# Patient Record
Sex: Female | Born: 1955 | Race: White | Hispanic: No | Marital: Married | Smoking: Former smoker
Health system: Southern US, Community
[De-identification: ages and names within clinical notes are randomized; demographics above are authoritative.]

## PROBLEM LIST (undated history)

## (undated) DIAGNOSIS — E079 Disorder of thyroid, unspecified: Secondary | ICD-10-CM

## (undated) DIAGNOSIS — I10 Essential (primary) hypertension: Secondary | ICD-10-CM

## (undated) DIAGNOSIS — M1990 Unspecified osteoarthritis, unspecified site: Secondary | ICD-10-CM

## (undated) DIAGNOSIS — T7840XA Allergy, unspecified, initial encounter: Secondary | ICD-10-CM

## (undated) DIAGNOSIS — F419 Anxiety disorder, unspecified: Secondary | ICD-10-CM

## (undated) DIAGNOSIS — N159 Renal tubulo-interstitial disease, unspecified: Secondary | ICD-10-CM

## (undated) DIAGNOSIS — K449 Diaphragmatic hernia without obstruction or gangrene: Secondary | ICD-10-CM

## (undated) DIAGNOSIS — F32A Depression, unspecified: Secondary | ICD-10-CM

## (undated) DIAGNOSIS — R5383 Other fatigue: Secondary | ICD-10-CM

## (undated) DIAGNOSIS — M549 Dorsalgia, unspecified: Secondary | ICD-10-CM

## (undated) DIAGNOSIS — M419 Scoliosis, unspecified: Secondary | ICD-10-CM

## (undated) DIAGNOSIS — Z720 Tobacco use: Secondary | ICD-10-CM

## (undated) DIAGNOSIS — S46009A Unspecified injury of muscle(s) and tendon(s) of the rotator cuff of unspecified shoulder, initial encounter: Secondary | ICD-10-CM

## (undated) DIAGNOSIS — M25569 Pain in unspecified knee: Secondary | ICD-10-CM

## (undated) DIAGNOSIS — R7303 Prediabetes: Secondary | ICD-10-CM

## (undated) DIAGNOSIS — E785 Hyperlipidemia, unspecified: Secondary | ICD-10-CM

## (undated) DIAGNOSIS — R600 Localized edema: Secondary | ICD-10-CM

## (undated) DIAGNOSIS — E039 Hypothyroidism, unspecified: Secondary | ICD-10-CM

## (undated) DIAGNOSIS — Z6834 Body mass index (BMI) 34.0-34.9, adult: Secondary | ICD-10-CM

## (undated) DIAGNOSIS — E559 Vitamin D deficiency, unspecified: Secondary | ICD-10-CM

## (undated) DIAGNOSIS — M25559 Pain in unspecified hip: Secondary | ICD-10-CM

## (undated) DIAGNOSIS — E669 Obesity, unspecified: Secondary | ICD-10-CM

## (undated) DIAGNOSIS — K219 Gastro-esophageal reflux disease without esophagitis: Secondary | ICD-10-CM

## (undated) HISTORY — DX: Localized edema: R60.0

## (undated) HISTORY — PX: ROTATOR CUFF REPAIR: SHX139

## (undated) HISTORY — DX: Vitamin D deficiency, unspecified: E55.9

## (undated) HISTORY — DX: Tobacco use: Z72.0

## (undated) HISTORY — DX: Essential (primary) hypertension: I10

## (undated) HISTORY — DX: Renal tubulo-interstitial disease, unspecified: N15.9

## (undated) HISTORY — DX: Unspecified osteoarthritis, unspecified site: M19.90

## (undated) HISTORY — DX: Depression, unspecified: F32.A

## (undated) HISTORY — DX: Anxiety disorder, unspecified: F41.9

## (undated) HISTORY — DX: Body mass index (BMI) 34.0-34.9, adult: Z68.34

## (undated) HISTORY — DX: Allergy, unspecified, initial encounter: T78.40XA

## (undated) HISTORY — DX: Dorsalgia, unspecified: M54.9

## (undated) HISTORY — DX: Obesity, unspecified: E66.9

## (undated) HISTORY — DX: Diaphragmatic hernia without obstruction or gangrene: K44.9

## (undated) HISTORY — PX: DILATION AND CURETTAGE OF UTERUS: SHX78

## (undated) HISTORY — DX: Other fatigue: R53.83

## (undated) HISTORY — DX: Scoliosis, unspecified: M41.9

## (undated) HISTORY — DX: Hypothyroidism, unspecified: E03.9

## (undated) HISTORY — DX: Prediabetes: R73.03

## (undated) HISTORY — DX: Unspecified injury of muscle(s) and tendon(s) of the rotator cuff of unspecified shoulder, initial encounter: S46.009A

## (undated) HISTORY — DX: Disorder of thyroid, unspecified: E07.9

## (undated) HISTORY — DX: Gastro-esophageal reflux disease without esophagitis: K21.9

## (undated) HISTORY — DX: Hyperlipidemia, unspecified: E78.5

## (undated) HISTORY — DX: Pain in unspecified knee: M25.569

## (undated) HISTORY — DX: Pain in unspecified hip: M25.559

## (undated) HISTORY — PX: COLONOSCOPY: SHX174

---

## 2002-06-11 ENCOUNTER — Emergency Department (HOSPITAL_COMMUNITY): Admission: EM | Admit: 2002-06-11 | Discharge: 2002-06-12 | Payer: Self-pay | Admitting: Emergency Medicine

## 2011-03-20 NOTE — Consult Note (Signed)
NAME:  Theresa Butler, Theresa Butler                       ACCOUNT NO.:  [id number]   MEDICAL RECORD NO.:  [id number]                   PATIENT TYPE:  EMS   LOCATION:  [facility]                                 FACILITY:  [facility]   PHYSICIAN:  Mary A. Contogiannis, M.D.          DATE OF BIRTH:  17-Aug-1956   DATE OF CONSULTATION:  06/11/2002  DATE OF DISCHARGE:  06/12/2002                           PLASTIC SURGERY CONSULTATION   HISTORY OF PRESENT ILLNESS:  The patient is a 55 year old Caucasian female  who earlier tonight leaned down and smiled at a stray dog, and then was  bitten on her left upper lip and nose.  She thinks the dog may have been a  small collie.  This happened in [place].  The patient presents from [place]  with the request of repair of her lacerations.   PAST MEDICAL HISTORY:  Denies cardiac, lung, liver or kidney disease.  She  notes that her tetanus is up-to-date.   PAST SURGICAL HISTORY:  In 2000 she had a dilatation and curettage.   CURRENT MEDICATIONS:  Prozac.   ALLERGIES:  PENICILLIN and SEPTRA cause a rash, but she has taken Augmentin  before without any side effects or allergies.  CODEINE causes nausea and  vomiting.   SOCIAL HISTORY:  The patient smokes one to 1 packs per day of cigarettes.  She is married and lives in [place].   PHYSICAL EXAMINATION:  GENERAL:  Well-developed, well-nourished, 45-year-  old, Caucasian female in no acute distress.   HEENT:  Normocephalic.  Pupils equal, round and reactive to light.  Extraocular muscles intact.  Oropharynx is without erythema.  She has a 2.7  cm, left nasal, vertical, midline laceration with abrasions and contusions  that is intermediate grade.  She also has a 1.4 cm, complex, left cupid's  bow laceration that crosses the vermilion border and goes into the philtral  column area.  There is a complex, stellate, Y-shape, left upper lip  laceration with missing mucosa and partial soft tissue loss of the muscle  and  subcutaneous tissue with a defect that measures approximately 3 x 2 cm.  There is a 0.8 cm and 0.3 cm, simple, left upper lip lacerations.  Abrasions  and contusions are present around all of these lacerations.  Moderate  bruising and swelling is present of the left upper lip.   IMPRESSION:  Multiple complex, intermediate, simple dog bite lacerations to  the face with abrasions and contusions.  In particular, the left upper lip  has some missing soft tissue as well.  At the patient's request, these  lacerations and abrasions are all repaired in the emergency room under local  anesthesia.  The patient was then discharged home in stable condition in the  care of her husband with the following instructions:   1. Take Augmentin 875 mg p.o. b.i.d. x10 days.  2. Clean the repaired lacerations and incisions with water or half-strength     hydrogen peroxide  three times a day as needed to remove dried blood.  3.     Apply bacitracin ointment to the lacerations and abrasions three times a     day.  4. Ice packs to the left lip and nose areas for the next 24 to 48 hours.  5. Call my office at [phone] to discuss a followup appointment for Monday,     June 12, 2002.                                               Mary A. Contogiannis, M.D.    MAC/MEDQ  D:  06/13/2002  T:  06/17/2002  Job:  [id number]   cc:   Mary A. Contogiannis, M.D.

## 2011-03-20 NOTE — Op Note (Signed)
NAME:  Theresa Butler, Theresa Butler                       ACCOUNT NO.:  [id number]   MEDICAL RECORD NO.:  [id number]                   PATIENT TYPE:  EMS   LOCATION:  [facility]                                 FACILITY:  [facility]   PHYSICIAN:  Mary A. Contogiannis, M.D.          DATE OF BIRTH:  1955-12-31   DATE OF PROCEDURE:  06/11/2002  DATE OF DISCHARGE:  06/12/2002                                 OPERATIVE REPORT   PREOPERATIVE DIAGNOSIS:  Multiple dog bite lacerations to the face.   POSTOPERATIVE DIAGNOSIS:  Multiple dog bite lacerations to the face.   PROCEDURE:  1. Repair of complex, stellate Y shaped laceration to the left upper lip     with missing soft tissue defect measuring 3.3 by 2 cm using a local     rotation flap.  2. Debridement and repair of 1.4 cm complex left upper lip laceration.  3. Debridement and repair of 0.8 cm simple left upper lip laceration.  4. Debridement and repair of 0.3 cm simple left upper lip laceration.  5. Debridement and repair of intermediate 2.7 cm nasal laceration.   SURGEON:  Mary A. Contogiannis, M.D.   ANESTHESIA:  1% lidocaine with epinephrine.   COMPLICATIONS:  None.   INDICATIONS FOR PROCEDURE:  The patient is a 55 year old Caucasian female  who, on the evening of June 11, 2002, presented to the ER with request for  repair of several facial lacerations due to dog bite injury.  This was late  in the evening and so by the time that the repair is done, it is early on  the morning of June 12, 2002. The patient requested that we proceed with  repair of the lacerations.   PROCEDURE:  The patient was lying supine on the stretcher in the emergency  room.  The face was prepped with Betadine and draped in a sterile fashion.  The skin and subcutaneous tissues in all the areas of the dog bite injuries  were then injected with 1% lidocaine with epinephrine.  After adequate  hemostasis and anesthesia had taken affect, the procedure was begun.  First,  the  nasal laceration was debrided.  The wound was then irrigated with  saline.  The incision was closed in intermediate fashion using 4-0 Monocryl  in the deeper subcutaneous tissues followed by 5-0 Monocryl in the dermal  layer and a 6-0 Prolene running baseball stitch on the skin.   Attention was turned to the left upper lip.  There were several lacerations  in this area.  In particular, the patient had a complex, stellate Y shaped  left upper lip laceration over the left half of the lip that did have  missing mucosa along with missing subcutaneous tissue and muscle.  This  wound was then sharply debrided.  The soft tissue loss was not enough that  the wound could not be closed with a local rotation flap.  The wound was  irrigated with saline  irrigation.  A local rotation flap was then performed  from the soft tissues.  The deeper muscular layer was closed using 3-0  Monocryl suture.  The deeper subcutaneous tissues were then closed using 4-0  Monocryl suture.  The mucosal layer was closed with 4-0 Monocryl suture.  This laceration did cross the vermilion border and did go onto the skin  above the upper lip.  The dermal layer of the skin was also closed with 4-0  Monocryl suture.  The skin was closed with 6-0 Prolene in a running mattress  type suture beginning with the skin above the lip and going across the  vermilion border down to the wet mucosal line.  I was careful to realign the  vermilion border first before placing this suture.  The rest of the incision  was then closed using 5-0 chromic gut suture on the wet mucosa.   Additionally, the patient had a 1.4 cm complex left upper lip laceration in  the cupids bow area.  These tissues were sharply debrided, the wound was  irrigated with saline.  The muscular layer was closed using 4-0 Monocryl  suture.  The submucosal and dermal layer was closed using 4-0 Monocryl  suture.  The skin was closed with 6-0 Prolene suture.  Part of this did  have  a jagged edge in the filtral column area and that was debrided and repaired  as part of this laceration.  The total length of the incision for the  repaired rotation flap was 3.3 cm.  The patient also had two simple  lacerations on the left upper lip mucosa measuring 0.3 cm and 0.8 cm.  These  were debrided and then repaired using 5-0 chromic gut suture.  All the  incisions were then dressed with Bacitracin ointment.  There were no  complications.  The patient tolerated the procedure well.  By the time all  the repairs were repaired, it was early on the morning of June 12, 2002.   The patient was then discharged home in stable condition in care of her  husband with the following instructions:  1. Take Augmentin 875 mg p.o. b.i.d. x 10 days.  2. Clean the repair lacerations with water and hydrogen peroxide three times     a day to remove blood.  3. Apply Bacitracin ointment to the lacerations and abrasions three times a     day.  4. Use an ice pack for the left lip and nose areas for the next 24-48 hours.  5. Call my office at [phone] to schedule a follow up appointment for     Monday, June 12, 2002.                                               Mary A. Contogiannis, M.D.    MAC/MEDQ  D:  06/13/2002  T:  06/15/2002  Job:  [id number]

## 2011-09-06 ENCOUNTER — Ambulatory Visit (HOSPITAL_BASED_OUTPATIENT_CLINIC_OR_DEPARTMENT_OTHER): Payer: Medicaid Other

## 2017-08-03 DIAGNOSIS — E039 Hypothyroidism, unspecified: Secondary | ICD-10-CM

## 2017-08-03 DIAGNOSIS — R635 Abnormal weight gain: Secondary | ICD-10-CM

## 2017-08-03 HISTORY — DX: Hypothyroidism, unspecified: E03.9

## 2018-06-23 DIAGNOSIS — R3129 Other microscopic hematuria: Secondary | ICD-10-CM | POA: Diagnosis not present

## 2018-07-06 DIAGNOSIS — I8390 Asymptomatic varicose veins of unspecified lower extremity: Secondary | ICD-10-CM | POA: Diagnosis not present

## 2018-07-06 DIAGNOSIS — R600 Localized edema: Secondary | ICD-10-CM | POA: Diagnosis not present

## 2018-07-11 ENCOUNTER — Other Ambulatory Visit: Payer: Self-pay

## 2018-07-11 DIAGNOSIS — I83813 Varicose veins of bilateral lower extremities with pain: Secondary | ICD-10-CM

## 2018-08-08 DIAGNOSIS — R0602 Shortness of breath: Secondary | ICD-10-CM | POA: Diagnosis not present

## 2018-08-08 DIAGNOSIS — R600 Localized edema: Secondary | ICD-10-CM | POA: Diagnosis not present

## 2018-08-09 DIAGNOSIS — R079 Chest pain, unspecified: Secondary | ICD-10-CM | POA: Diagnosis not present

## 2018-09-08 DIAGNOSIS — R0602 Shortness of breath: Secondary | ICD-10-CM | POA: Diagnosis not present

## 2018-09-08 DIAGNOSIS — R600 Localized edema: Secondary | ICD-10-CM | POA: Diagnosis not present

## 2018-09-09 ENCOUNTER — Ambulatory Visit (HOSPITAL_COMMUNITY): Admission: RE | Admit: 2018-09-09 | Payer: Medicare Other | Source: Ambulatory Visit

## 2018-09-09 ENCOUNTER — Encounter: Payer: Medicare Other | Admitting: Vascular Surgery

## 2018-10-05 DIAGNOSIS — E039 Hypothyroidism, unspecified: Secondary | ICD-10-CM | POA: Diagnosis not present

## 2018-10-05 DIAGNOSIS — J329 Chronic sinusitis, unspecified: Secondary | ICD-10-CM | POA: Diagnosis not present

## 2018-10-05 DIAGNOSIS — R7303 Prediabetes: Secondary | ICD-10-CM | POA: Diagnosis not present

## 2018-10-05 DIAGNOSIS — E785 Hyperlipidemia, unspecified: Secondary | ICD-10-CM | POA: Diagnosis not present

## 2018-10-05 DIAGNOSIS — E559 Vitamin D deficiency, unspecified: Secondary | ICD-10-CM | POA: Diagnosis not present

## 2018-10-05 DIAGNOSIS — I10 Essential (primary) hypertension: Secondary | ICD-10-CM | POA: Diagnosis not present

## 2018-10-28 ENCOUNTER — Telehealth (HOSPITAL_COMMUNITY): Payer: Self-pay | Admitting: Surgery

## 2018-10-28 NOTE — Telephone Encounter (Signed)
Attempted to contact patient to confirm appointments for 10/31/2018 [facility] 

## 2018-10-31 ENCOUNTER — Encounter: Payer: Medicare Other | Admitting: Vascular Surgery

## 2018-10-31 ENCOUNTER — Encounter (HOSPITAL_COMMUNITY): Payer: Medicare Other

## 2018-11-06 DIAGNOSIS — J019 Acute sinusitis, unspecified: Secondary | ICD-10-CM | POA: Diagnosis not present

## 2018-11-06 DIAGNOSIS — H6692 Otitis media, unspecified, left ear: Secondary | ICD-10-CM | POA: Diagnosis not present

## 2018-11-23 DIAGNOSIS — M25562 Pain in left knee: Secondary | ICD-10-CM | POA: Diagnosis not present

## 2018-11-23 DIAGNOSIS — Z87828 Personal history of other (healed) physical injury and trauma: Secondary | ICD-10-CM | POA: Diagnosis not present

## 2018-11-30 DIAGNOSIS — M1712 Unilateral primary osteoarthritis, left knee: Secondary | ICD-10-CM | POA: Diagnosis not present

## 2018-11-30 DIAGNOSIS — M23212 Derangement of anterior horn of medial meniscus due to old tear or injury, left knee: Secondary | ICD-10-CM | POA: Diagnosis not present

## 2018-11-30 DIAGNOSIS — M25562 Pain in left knee: Secondary | ICD-10-CM | POA: Diagnosis not present

## 2018-11-30 DIAGNOSIS — M23222 Derangement of posterior horn of medial meniscus due to old tear or injury, left knee: Secondary | ICD-10-CM | POA: Diagnosis not present

## 2018-12-06 DIAGNOSIS — M1712 Unilateral primary osteoarthritis, left knee: Secondary | ICD-10-CM | POA: Diagnosis not present

## 2018-12-09 DIAGNOSIS — J301 Allergic rhinitis due to pollen: Secondary | ICD-10-CM | POA: Diagnosis not present

## 2018-12-20 DIAGNOSIS — R002 Palpitations: Secondary | ICD-10-CM | POA: Diagnosis not present

## 2018-12-20 DIAGNOSIS — R600 Localized edema: Secondary | ICD-10-CM | POA: Diagnosis not present

## 2018-12-23 ENCOUNTER — Inpatient Hospital Stay (HOSPITAL_COMMUNITY): Admission: RE | Admit: 2018-12-23 | Payer: Medicare Other | Source: Ambulatory Visit

## 2018-12-23 ENCOUNTER — Encounter: Payer: Medicare Other | Admitting: Vascular Surgery

## 2018-12-28 DIAGNOSIS — R3129 Other microscopic hematuria: Secondary | ICD-10-CM | POA: Diagnosis not present

## 2018-12-28 DIAGNOSIS — Z87448 Personal history of other diseases of urinary system: Secondary | ICD-10-CM | POA: Diagnosis not present

## 2018-12-28 DIAGNOSIS — R600 Localized edema: Secondary | ICD-10-CM | POA: Diagnosis not present

## 2018-12-29 ENCOUNTER — Encounter (HOSPITAL_COMMUNITY): Payer: Medicare Other

## 2018-12-29 ENCOUNTER — Encounter: Payer: Medicare Other | Admitting: Vascular Surgery

## 2019-01-10 DIAGNOSIS — R109 Unspecified abdominal pain: Secondary | ICD-10-CM | POA: Diagnosis not present

## 2019-01-10 DIAGNOSIS — R3129 Other microscopic hematuria: Secondary | ICD-10-CM | POA: Diagnosis not present

## 2019-01-12 DIAGNOSIS — E875 Hyperkalemia: Secondary | ICD-10-CM | POA: Diagnosis not present

## 2019-01-13 DIAGNOSIS — J309 Allergic rhinitis, unspecified: Secondary | ICD-10-CM | POA: Diagnosis not present

## 2019-01-14 DIAGNOSIS — J309 Allergic rhinitis, unspecified: Secondary | ICD-10-CM | POA: Diagnosis not present

## 2019-01-15 DIAGNOSIS — J309 Allergic rhinitis, unspecified: Secondary | ICD-10-CM | POA: Diagnosis not present

## 2019-01-16 DIAGNOSIS — J309 Allergic rhinitis, unspecified: Secondary | ICD-10-CM | POA: Diagnosis not present

## 2019-01-17 DIAGNOSIS — J309 Allergic rhinitis, unspecified: Secondary | ICD-10-CM | POA: Diagnosis not present

## 2019-01-18 DIAGNOSIS — J309 Allergic rhinitis, unspecified: Secondary | ICD-10-CM | POA: Diagnosis not present

## 2019-01-19 DIAGNOSIS — M1712 Unilateral primary osteoarthritis, left knee: Secondary | ICD-10-CM | POA: Diagnosis not present

## 2019-01-26 DIAGNOSIS — M1712 Unilateral primary osteoarthritis, left knee: Secondary | ICD-10-CM | POA: Diagnosis not present

## 2019-01-27 ENCOUNTER — Encounter: Payer: Self-pay | Admitting: Vascular Surgery

## 2019-01-30 ENCOUNTER — Encounter (HOSPITAL_COMMUNITY): Payer: Medicare Other

## 2019-01-30 ENCOUNTER — Encounter: Payer: Medicare Other | Admitting: Surgery

## 2019-02-02 DIAGNOSIS — M1712 Unilateral primary osteoarthritis, left knee: Secondary | ICD-10-CM | POA: Diagnosis not present

## 2019-02-07 DIAGNOSIS — Z79899 Other long term (current) drug therapy: Secondary | ICD-10-CM | POA: Diagnosis not present

## 2019-02-27 DIAGNOSIS — R319 Hematuria, unspecified: Secondary | ICD-10-CM | POA: Diagnosis not present

## 2019-02-27 DIAGNOSIS — R21 Rash and other nonspecific skin eruption: Secondary | ICD-10-CM | POA: Diagnosis not present

## 2019-02-27 DIAGNOSIS — I10 Essential (primary) hypertension: Secondary | ICD-10-CM | POA: Diagnosis not present

## 2019-02-27 DIAGNOSIS — R809 Proteinuria, unspecified: Secondary | ICD-10-CM | POA: Diagnosis not present

## 2019-02-27 DIAGNOSIS — D631 Anemia in chronic kidney disease: Secondary | ICD-10-CM | POA: Diagnosis not present

## 2019-03-30 DIAGNOSIS — R319 Hematuria, unspecified: Secondary | ICD-10-CM | POA: Diagnosis not present

## 2019-03-30 DIAGNOSIS — R809 Proteinuria, unspecified: Secondary | ICD-10-CM | POA: Diagnosis not present

## 2019-03-30 DIAGNOSIS — R21 Rash and other nonspecific skin eruption: Secondary | ICD-10-CM | POA: Diagnosis not present

## 2019-03-30 DIAGNOSIS — I10 Essential (primary) hypertension: Secondary | ICD-10-CM | POA: Diagnosis not present

## 2019-04-11 DIAGNOSIS — I10 Essential (primary) hypertension: Secondary | ICD-10-CM | POA: Diagnosis not present

## 2019-04-11 DIAGNOSIS — E559 Vitamin D deficiency, unspecified: Secondary | ICD-10-CM | POA: Diagnosis not present

## 2019-04-11 DIAGNOSIS — E039 Hypothyroidism, unspecified: Secondary | ICD-10-CM | POA: Diagnosis not present

## 2019-04-11 DIAGNOSIS — F329 Major depressive disorder, single episode, unspecified: Secondary | ICD-10-CM | POA: Diagnosis not present

## 2019-05-15 ENCOUNTER — Encounter: Payer: Self-pay | Admitting: Gastroenterology

## 2019-06-19 ENCOUNTER — Encounter: Payer: Medicare Other | Admitting: Gastroenterology

## 2019-06-27 DIAGNOSIS — J3081 Allergic rhinitis due to animal (cat) (dog) hair and dander: Secondary | ICD-10-CM | POA: Diagnosis not present

## 2019-06-27 DIAGNOSIS — M25562 Pain in left knee: Secondary | ICD-10-CM | POA: Diagnosis not present

## 2019-06-27 DIAGNOSIS — Z79891 Long term (current) use of opiate analgesic: Secondary | ICD-10-CM | POA: Diagnosis not present

## 2019-06-27 DIAGNOSIS — M542 Cervicalgia: Secondary | ICD-10-CM | POA: Diagnosis not present

## 2019-06-27 DIAGNOSIS — M25511 Pain in right shoulder: Secondary | ICD-10-CM | POA: Diagnosis not present

## 2019-06-27 DIAGNOSIS — M25512 Pain in left shoulder: Secondary | ICD-10-CM | POA: Diagnosis not present

## 2019-06-27 DIAGNOSIS — G894 Chronic pain syndrome: Secondary | ICD-10-CM | POA: Diagnosis not present

## 2019-06-27 DIAGNOSIS — J301 Allergic rhinitis due to pollen: Secondary | ICD-10-CM | POA: Diagnosis not present

## 2019-06-27 DIAGNOSIS — G8929 Other chronic pain: Secondary | ICD-10-CM | POA: Diagnosis not present

## 2019-06-27 DIAGNOSIS — F1120 Opioid dependence, uncomplicated: Secondary | ICD-10-CM | POA: Diagnosis not present

## 2019-06-27 DIAGNOSIS — M545 Low back pain: Secondary | ICD-10-CM | POA: Diagnosis not present

## 2019-10-02 DIAGNOSIS — Z20828 Contact with and (suspected) exposure to other viral communicable diseases: Secondary | ICD-10-CM | POA: Diagnosis not present

## 2019-10-02 DIAGNOSIS — R519 Headache, unspecified: Secondary | ICD-10-CM | POA: Diagnosis not present

## 2019-12-06 DIAGNOSIS — Z136 Encounter for screening for cardiovascular disorders: Secondary | ICD-10-CM | POA: Diagnosis not present

## 2019-12-06 DIAGNOSIS — Z0000 Encounter for general adult medical examination without abnormal findings: Secondary | ICD-10-CM | POA: Diagnosis not present

## 2019-12-06 DIAGNOSIS — Z9181 History of falling: Secondary | ICD-10-CM | POA: Diagnosis not present

## 2019-12-06 DIAGNOSIS — E785 Hyperlipidemia, unspecified: Secondary | ICD-10-CM | POA: Diagnosis not present

## 2019-12-06 DIAGNOSIS — Z1211 Encounter for screening for malignant neoplasm of colon: Secondary | ICD-10-CM | POA: Diagnosis not present

## 2019-12-12 ENCOUNTER — Encounter: Payer: Self-pay | Admitting: Gastroenterology

## 2019-12-18 DIAGNOSIS — R21 Rash and other nonspecific skin eruption: Secondary | ICD-10-CM | POA: Diagnosis not present

## 2019-12-18 DIAGNOSIS — L298 Other pruritus: Secondary | ICD-10-CM | POA: Diagnosis not present

## 2019-12-19 DIAGNOSIS — Z1231 Encounter for screening mammogram for malignant neoplasm of breast: Secondary | ICD-10-CM | POA: Diagnosis not present

## 2019-12-22 DIAGNOSIS — M542 Cervicalgia: Secondary | ICD-10-CM | POA: Diagnosis not present

## 2019-12-22 DIAGNOSIS — M25562 Pain in left knee: Secondary | ICD-10-CM | POA: Diagnosis not present

## 2019-12-22 DIAGNOSIS — G8929 Other chronic pain: Secondary | ICD-10-CM | POA: Diagnosis not present

## 2019-12-22 DIAGNOSIS — M545 Low back pain: Secondary | ICD-10-CM | POA: Diagnosis not present

## 2019-12-22 DIAGNOSIS — M25512 Pain in left shoulder: Secondary | ICD-10-CM | POA: Diagnosis not present

## 2019-12-22 DIAGNOSIS — M25511 Pain in right shoulder: Secondary | ICD-10-CM | POA: Diagnosis not present

## 2019-12-22 DIAGNOSIS — G894 Chronic pain syndrome: Secondary | ICD-10-CM | POA: Diagnosis not present

## 2019-12-22 DIAGNOSIS — Z79891 Long term (current) use of opiate analgesic: Secondary | ICD-10-CM | POA: Diagnosis not present

## 2020-01-09 ENCOUNTER — Ambulatory Visit (AMBULATORY_SURGERY_CENTER): Payer: Self-pay | Admitting: *Deleted

## 2020-01-09 ENCOUNTER — Other Ambulatory Visit: Payer: Self-pay

## 2020-01-09 VITALS — Temp 96.0°F | Ht 66.0 in | Wt 194.0 lb

## 2020-01-09 DIAGNOSIS — Z1211 Encounter for screening for malignant neoplasm of colon: Secondary | ICD-10-CM

## 2020-01-09 DIAGNOSIS — Z01818 Encounter for other preprocedural examination: Secondary | ICD-10-CM

## 2020-01-09 MED ORDER — SUPREP BOWEL PREP KIT 17.5-3.13-1.6 GM/177ML PO SOLN: ORAL | 0 refills | Status: DC

## 2020-01-09 NOTE — Progress Notes (Signed)
Pt is aware that care partner will wait in the car during procedure; if they feel like they will be too hot or cold to wait in the car; they may wait in the 4 th floor lobby. Patient is aware to bring only one care partner. We want them to wear a mask (we do not have any that we can provide them), practice social distancing, and we will check their temperatures when they get here.  I did remind the patient that their care partner needs to stay in the parking lot the entire time and have a cell phone available, we will call them when the pt is ready for discharge. Patient will wear mask into building.   No trouble with anesthesia, difficulty with intubation or hx/fam hx of malignant hyperthermia per pt   No egg or soy allergy  No home oxygen use   No medications for weight loss taken  covid test 01-19-20 at 2:05 pm

## 2020-01-19 ENCOUNTER — Other Ambulatory Visit: Payer: Self-pay

## 2020-01-19 ENCOUNTER — Other Ambulatory Visit: Payer: Self-pay | Admitting: Gastroenterology

## 2020-01-19 ENCOUNTER — Ambulatory Visit (INDEPENDENT_AMBULATORY_CARE_PROVIDER_SITE_OTHER): Payer: Medicare Other

## 2020-01-19 DIAGNOSIS — Z1159 Encounter for screening for other viral diseases: Secondary | ICD-10-CM | POA: Diagnosis not present

## 2020-01-20 LAB — SARS CORONAVIRUS 2 (TAT 6-24 HRS): SARS Coronavirus 2: NEGATIVE

## 2020-01-23 ENCOUNTER — Encounter: Payer: Self-pay | Admitting: Gastroenterology

## 2020-01-23 ENCOUNTER — Other Ambulatory Visit: Payer: Self-pay

## 2020-01-23 ENCOUNTER — Ambulatory Visit (AMBULATORY_SURGERY_CENTER): Payer: Medicare Other | Admitting: Gastroenterology

## 2020-01-23 VITALS — BP 144/72 | HR 63 | Temp 97.1°F | Resp 15 | Ht 66.0 in | Wt 194.0 lb

## 2020-01-23 DIAGNOSIS — Z03818 Encounter for observation for suspected exposure to other biological agents ruled out: Secondary | ICD-10-CM | POA: Diagnosis not present

## 2020-01-23 DIAGNOSIS — Z800 Family history of malignant neoplasm of digestive organs: Secondary | ICD-10-CM

## 2020-01-23 DIAGNOSIS — Z1211 Encounter for screening for malignant neoplasm of colon: Secondary | ICD-10-CM

## 2020-01-23 MED ORDER — SODIUM CHLORIDE 0.9 % IV SOLN: 500.0000 mL | Freq: Once | INTRAVENOUS | Status: DC

## 2020-01-23 NOTE — Op Note (Signed)
[facility] Patient Name: Theresa Butler Procedure Date: 01/23/2020 11:31 AM MRN: [id number] Endoscopist: Lynann Bologna , MD Age: 64 Referring MD:  Date of Birth: 11-04-1955 Gender: Female Account #: [id number] Procedure:                Colonoscopy Indications:              Screening for colorectal malignant neoplasm, Colon                            cancer screening in patient at increased risk:                            Family history of colorectal cancer in multiple 2nd                            degree relatives Medicines:                Monitored Anesthesia Care Procedure:                Pre-Anesthesia Assessment:                           - Prior to the procedure, a History and Physical                            was performed, and patient medications and                            allergies were reviewed. The patient's tolerance of                            previous anesthesia was also reviewed. The risks                            and benefits of the procedure and the sedation                            options and risks were discussed with the patient.                            All questions were answered, and informed consent                            was obtained. Prior Anticoagulants: The patient has                            taken no previous anticoagulant or antiplatelet                            agents. ASA Grade Assessment: II - A patient with                            mild systemic disease. After reviewing the risks  and benefits, the patient was deemed in                            satisfactory condition to undergo the procedure.                           After obtaining informed consent, the colonoscope                            was passed under direct vision. Throughout the                            procedure, the patient's blood pressure, pulse, and                            oxygen saturations were monitored  continuously. The                            Colonoscope CFH190 was introduced through the and                            advanced to the 2 cm into the ileum. The                            colonoscopy was somewhat difficult due to poor                            bowel prep (especially in the right side of the                            colon) and a tortuous colon. Successful completion                            of the procedure was aided by applying abdominal                            pressure and lavage. The patient tolerated the                            procedure well. The quality of the bowel                            preparation was adequate in the left side of the                            colon. However, there was significant retained                            adherent stool in the right side of the colon                            making it somewhat harder to visualize despite  2-day prep. Aggressive suctioning and aspiration                            was performed. Overall over 85-90% of the colonic                            mucosa was visualized satisfactorily. The terminal                            ileum, ileocecal valve, appendiceal orifice, and                            rectum were photographed. Scope In: 11:33:56 AM Scope Out: 11:53:38 AM Scope Withdrawal Time: 0 hours 11 minutes 21 seconds  Total Procedure Duration: 0 hours 19 minutes 42 seconds  Findings:                 The colon (entire examined portion) appeared normal                            except for mild melanosis coli. The colon was                            highly redundant.                           A few small-mouthed diverticula were found in the                            sigmoid colon.                           Non-bleeding internal hemorrhoids were found during                            retroflexion. The hemorrhoids were small.                           The terminal  ileum appeared normal. Complications:            No immediate complications. Estimated Blood Loss:     Estimated blood loss: none. Impression:               -Mild sigmoid diverticulosis.                           -Highly redundant colon.                           -Mild melanosis coli.                           -Non-bleeding internal hemorrhoids.                           -Otherwise grossly normal colonoscopy to TI. Recommendation:           - Patient has a contact number available for  emergencies. The signs and symptoms of potential                            delayed complications were discussed with the                            patient. Return to normal activities tomorrow.                            Written discharge instructions were provided to the                            patient.                           - High fiber diet.                           - Continue present medications.                           - Repeat colonoscopy in 5 years for screening                            purposes. Earlier, if with any new problems or if                            there is any change in family history.                           - Return to GI office PRN.                           - Miralax 1 capful (17 grams) in 8 ounces of water                            PO BID. Lynann Bologna, MD 01/23/2020 12:01:49 PM This report has been signed electronically.

## 2020-01-23 NOTE — Patient Instructions (Signed)
YOU HAD AN ENDOSCOPIC PROCEDURE TODAY AT THE [facility]:   Refer to the procedure report that was given to you for any specific questions about what was found during the examination.  If the procedure report does not answer your questions, please call your gastroenterologist to clarify.  If you requested that your care partner not be given the details of your procedure findings, then the procedure report has been included in a sealed envelope for you to review at your convenience later.  YOU SHOULD EXPECT: Some feelings of bloating in the abdomen. Passage of more gas than usual.  Walking can help get rid of the air that was put into your GI tract during the procedure and reduce the bloating. If you had a lower endoscopy (such as a colonoscopy or flexible sigmoidoscopy) you may notice spotting of blood in your stool or on the toilet paper. If you underwent a bowel prep for your procedure, you may not have a normal bowel movement for a few days.  Please Note:  You might notice some irritation and congestion in your nose or some drainage.  This is from the oxygen used during your procedure.  There is no need for concern and it should clear up in a day or so.  SYMPTOMS TO REPORT IMMEDIATELY:   Following lower endoscopy (colonoscopy or flexible sigmoidoscopy):  Excessive amounts of blood in the stool  Significant tenderness or worsening of abdominal pains  Swelling of the abdomen that is new, acute  Fever of 100F or higher  For urgent or emergent issues, a gastroenterologist can be reached at any hour by calling [phone]. Do not use MyChart messaging for urgent concerns.   DIET:  We do recommend a small meal at first, but then you may proceed to your regular diet.  Drink plenty of fluids but you should avoid alcoholic beverages for 24 hours.  ACTIVITY:  You should plan to take it easy for the rest of today and you should NOT DRIVE or use heavy machinery until tomorrow (because of  the sedation medicines used during the test).    FOLLOW UP: Our staff will call the number listed on your records 48-72 hours following your procedure to check on you and address any questions or concerns that you may have regarding the information given to you following your procedure. If we do not reach you, we will leave a message.  We will attempt to reach you two times.  During this call, we will ask if you have developed any symptoms of COVID 19. If you develop any symptoms (ie: fever, flu-like symptoms, shortness of breath, cough etc.) before then, please call [phone].  If you test positive for Covid 19 in the 2 weeks post procedure, please call and report this information to [place].    SIGNATURES/CONFIDENTIALITY: You and/or your care partner have signed paperwork which will be entered into your electronic medical record.  These signatures attest to the fact that that the information above on your After Visit Summary has been reviewed and is understood.  Full responsibility of the confidentiality of this discharge information lies with you and/or your care-partner.  Next colonoscopy- 5 years, please call if you have any new problems or changes in family history  Please follow high fiber diet- read over handouts about diverticulosis and hemorrhoids  Take Miralax 1 capful (17 grams) in 8 ounces of water twice daily

## 2020-01-23 NOTE — Progress Notes (Signed)
Report to PACU, RN, vss, BBS= Clear.  

## 2020-01-23 NOTE — Progress Notes (Signed)
Pt's states no medical or surgical changes since previsit or office visit.  Temp- June Vitals- Donna 

## 2020-01-24 ENCOUNTER — Telehealth: Payer: Self-pay | Admitting: Gastroenterology

## 2020-01-24 DIAGNOSIS — R109 Unspecified abdominal pain: Secondary | ICD-10-CM

## 2020-01-24 NOTE — Telephone Encounter (Signed)
Pt called with c/o gas pain under her left breast post colonoscopy on 3/23/ This was 7-8/10 this morning but she has since taken Gas-X with relief now 3/10. Will let Dr. Chales Abrahams know to see if further advice given,

## 2020-01-25 ENCOUNTER — Telehealth: Payer: Self-pay

## 2020-01-25 NOTE — Telephone Encounter (Signed)
I have called the Theresa Butler over the phone She does feel better Has been taking Gas-X. No fever or chills. She will let us know if she has any problems  Trish, can you please call her early next week to make sure she is okay  RG

## 2020-01-25 NOTE — Telephone Encounter (Signed)
  Follow up Call-  Call back number 01/23/2020  Post procedure Call Back phone  # [phone]  Permission to leave phone message Yes  Some recent data might be hidden     Patient questions:  Do you have a fever, pain , or abdominal swelling? Yes.   Pain Score  3 * Patient having some abdominal pain but states that it has greatly improved after taking gas medicine yesterday. Abdominal pressure and lavage was used during the procedure and it was communicated to the patient that this could also contribute to some soreness. Patient states that pain is getting a lot better and that she will call back if it begins to worsen.   Have you tolerated food without any problems? Yes.    Have you been able to return to your normal activities? Yes.    Do you have any questions about your discharge instructions: Diet   No. Medications  No. Follow up visit  No.  Do you have questions or concerns about your Care? No.  Actions: * If pain score is 4 or above: No action needed, pain <4.   1. Have you developed a fever since your procedure? No  2.   Have you had an respiratory symptoms (SOB or cough) since your procedure? No  3.   Have you tested positive for COVID 19 since your procedure No  4.   Have you had any family members/close contacts diagnosed with the COVID 19 since your procedure?  No   If yes to any of these questions please route to Laverna Peace, RN and Charlett Lango, RN

## 2020-01-26 ENCOUNTER — Ambulatory Visit (HOSPITAL_BASED_OUTPATIENT_CLINIC_OR_DEPARTMENT_OTHER)
Admission: RE | Admit: 2020-01-26 | Discharge: 2020-01-26 | Disposition: A | Discharge status: Home / Self Care | Payer: Medicare Other | Source: Ambulatory Visit | Attending: Gastroenterology | Admitting: Gastroenterology

## 2020-01-26 ENCOUNTER — Other Ambulatory Visit: Payer: Self-pay

## 2020-01-26 DIAGNOSIS — G8929 Other chronic pain: Secondary | ICD-10-CM | POA: Diagnosis not present

## 2020-01-26 DIAGNOSIS — R1012 Left upper quadrant pain: Secondary | ICD-10-CM | POA: Diagnosis not present

## 2020-01-26 DIAGNOSIS — G894 Chronic pain syndrome: Secondary | ICD-10-CM | POA: Diagnosis not present

## 2020-01-26 DIAGNOSIS — M542 Cervicalgia: Secondary | ICD-10-CM | POA: Diagnosis not present

## 2020-01-26 DIAGNOSIS — Z79891 Long term (current) use of opiate analgesic: Secondary | ICD-10-CM | POA: Diagnosis not present

## 2020-01-26 DIAGNOSIS — R109 Unspecified abdominal pain: Secondary | ICD-10-CM | POA: Insufficient documentation

## 2020-01-26 DIAGNOSIS — M25512 Pain in left shoulder: Secondary | ICD-10-CM | POA: Diagnosis not present

## 2020-01-26 DIAGNOSIS — M25562 Pain in left knee: Secondary | ICD-10-CM | POA: Diagnosis not present

## 2020-01-26 DIAGNOSIS — M25511 Pain in right shoulder: Secondary | ICD-10-CM | POA: Diagnosis not present

## 2020-01-26 DIAGNOSIS — M545 Low back pain: Secondary | ICD-10-CM | POA: Diagnosis not present

## 2020-01-26 NOTE — Telephone Encounter (Signed)
Pt called still feeling terrible did all that was suggested please call her back. Pt has doctor appointment  @ 3:00 pm today so before 3:00 please call this number [phone] after 3:00 pm please call [phone].

## 2020-01-26 NOTE — Telephone Encounter (Signed)
Per Dr. Chales Abrahams patient needs abdominal x ray. I have called and spoke to patient and she will come to [facility] and have this done today.

## 2020-02-19 DIAGNOSIS — Z03818 Encounter for observation for suspected exposure to other biological agents ruled out: Secondary | ICD-10-CM | POA: Diagnosis not present

## 2020-02-21 DIAGNOSIS — M1712 Unilateral primary osteoarthritis, left knee: Secondary | ICD-10-CM | POA: Diagnosis not present

## 2020-02-23 DIAGNOSIS — M25562 Pain in left knee: Secondary | ICD-10-CM | POA: Diagnosis not present

## 2020-02-23 DIAGNOSIS — Z79891 Long term (current) use of opiate analgesic: Secondary | ICD-10-CM | POA: Diagnosis not present

## 2020-02-23 DIAGNOSIS — M25511 Pain in right shoulder: Secondary | ICD-10-CM | POA: Diagnosis not present

## 2020-02-23 DIAGNOSIS — M542 Cervicalgia: Secondary | ICD-10-CM | POA: Diagnosis not present

## 2020-02-23 DIAGNOSIS — M25512 Pain in left shoulder: Secondary | ICD-10-CM | POA: Diagnosis not present

## 2020-02-23 DIAGNOSIS — G894 Chronic pain syndrome: Secondary | ICD-10-CM | POA: Diagnosis not present

## 2020-02-23 DIAGNOSIS — M545 Low back pain: Secondary | ICD-10-CM | POA: Diagnosis not present

## 2020-02-23 DIAGNOSIS — G8929 Other chronic pain: Secondary | ICD-10-CM | POA: Diagnosis not present

## 2020-02-29 NOTE — Telephone Encounter (Signed)
A user error has taken place: encounter opened in error, closed for administrative reasons.

## 2020-03-20 DIAGNOSIS — Z03818 Encounter for observation for suspected exposure to other biological agents ruled out: Secondary | ICD-10-CM | POA: Diagnosis not present

## 2020-03-22 DIAGNOSIS — M545 Low back pain: Secondary | ICD-10-CM | POA: Diagnosis not present

## 2020-03-22 DIAGNOSIS — M25511 Pain in right shoulder: Secondary | ICD-10-CM | POA: Diagnosis not present

## 2020-03-22 DIAGNOSIS — M25562 Pain in left knee: Secondary | ICD-10-CM | POA: Diagnosis not present

## 2020-03-22 DIAGNOSIS — G8929 Other chronic pain: Secondary | ICD-10-CM | POA: Diagnosis not present

## 2020-03-22 DIAGNOSIS — M25512 Pain in left shoulder: Secondary | ICD-10-CM | POA: Diagnosis not present

## 2020-03-22 DIAGNOSIS — G894 Chronic pain syndrome: Secondary | ICD-10-CM | POA: Diagnosis not present

## 2020-03-22 DIAGNOSIS — M542 Cervicalgia: Secondary | ICD-10-CM | POA: Diagnosis not present

## 2020-03-22 DIAGNOSIS — Z79891 Long term (current) use of opiate analgesic: Secondary | ICD-10-CM | POA: Diagnosis not present

## 2020-04-26 DIAGNOSIS — M25562 Pain in left knee: Secondary | ICD-10-CM | POA: Diagnosis not present

## 2020-04-26 DIAGNOSIS — M25511 Pain in right shoulder: Secondary | ICD-10-CM | POA: Diagnosis not present

## 2020-04-26 DIAGNOSIS — Z79891 Long term (current) use of opiate analgesic: Secondary | ICD-10-CM | POA: Diagnosis not present

## 2020-04-26 DIAGNOSIS — M545 Low back pain: Secondary | ICD-10-CM | POA: Diagnosis not present

## 2020-04-26 DIAGNOSIS — G894 Chronic pain syndrome: Secondary | ICD-10-CM | POA: Diagnosis not present

## 2020-04-26 DIAGNOSIS — M25512 Pain in left shoulder: Secondary | ICD-10-CM | POA: Diagnosis not present

## 2020-04-26 DIAGNOSIS — G8929 Other chronic pain: Secondary | ICD-10-CM | POA: Diagnosis not present

## 2020-04-26 DIAGNOSIS — M542 Cervicalgia: Secondary | ICD-10-CM | POA: Diagnosis not present

## 2020-05-15 DIAGNOSIS — M25559 Pain in unspecified hip: Secondary | ICD-10-CM | POA: Diagnosis not present

## 2020-05-15 DIAGNOSIS — R7303 Prediabetes: Secondary | ICD-10-CM | POA: Diagnosis not present

## 2020-05-15 DIAGNOSIS — E559 Vitamin D deficiency, unspecified: Secondary | ICD-10-CM | POA: Diagnosis not present

## 2020-05-30 DIAGNOSIS — M25562 Pain in left knee: Secondary | ICD-10-CM | POA: Diagnosis not present

## 2020-05-30 DIAGNOSIS — M25512 Pain in left shoulder: Secondary | ICD-10-CM | POA: Diagnosis not present

## 2020-05-30 DIAGNOSIS — G894 Chronic pain syndrome: Secondary | ICD-10-CM | POA: Diagnosis not present

## 2020-05-30 DIAGNOSIS — M545 Low back pain: Secondary | ICD-10-CM | POA: Diagnosis not present

## 2020-05-30 DIAGNOSIS — M25511 Pain in right shoulder: Secondary | ICD-10-CM | POA: Diagnosis not present

## 2020-05-30 DIAGNOSIS — G8929 Other chronic pain: Secondary | ICD-10-CM | POA: Diagnosis not present

## 2020-05-30 DIAGNOSIS — M542 Cervicalgia: Secondary | ICD-10-CM | POA: Diagnosis not present

## 2020-05-30 DIAGNOSIS — Z79891 Long term (current) use of opiate analgesic: Secondary | ICD-10-CM | POA: Diagnosis not present

## 2020-06-28 DIAGNOSIS — M25512 Pain in left shoulder: Secondary | ICD-10-CM | POA: Diagnosis not present

## 2020-06-28 DIAGNOSIS — M542 Cervicalgia: Secondary | ICD-10-CM | POA: Diagnosis not present

## 2020-06-28 DIAGNOSIS — Z79891 Long term (current) use of opiate analgesic: Secondary | ICD-10-CM | POA: Diagnosis not present

## 2020-06-28 DIAGNOSIS — M545 Low back pain: Secondary | ICD-10-CM | POA: Diagnosis not present

## 2020-06-28 DIAGNOSIS — M25511 Pain in right shoulder: Secondary | ICD-10-CM | POA: Diagnosis not present

## 2020-06-28 DIAGNOSIS — G894 Chronic pain syndrome: Secondary | ICD-10-CM | POA: Diagnosis not present

## 2020-06-28 DIAGNOSIS — G8929 Other chronic pain: Secondary | ICD-10-CM | POA: Diagnosis not present

## 2020-06-28 DIAGNOSIS — M25562 Pain in left knee: Secondary | ICD-10-CM | POA: Diagnosis not present

## 2020-07-17 DIAGNOSIS — J0100 Acute maxillary sinusitis, unspecified: Secondary | ICD-10-CM | POA: Diagnosis not present

## 2020-07-17 DIAGNOSIS — H66009 Acute suppurative otitis media without spontaneous rupture of ear drum, unspecified ear: Secondary | ICD-10-CM | POA: Diagnosis not present

## 2020-07-30 DIAGNOSIS — M25512 Pain in left shoulder: Secondary | ICD-10-CM | POA: Diagnosis not present

## 2020-07-30 DIAGNOSIS — G894 Chronic pain syndrome: Secondary | ICD-10-CM | POA: Diagnosis not present

## 2020-07-30 DIAGNOSIS — Z79891 Long term (current) use of opiate analgesic: Secondary | ICD-10-CM | POA: Diagnosis not present

## 2020-07-30 DIAGNOSIS — M542 Cervicalgia: Secondary | ICD-10-CM | POA: Diagnosis not present

## 2020-07-30 DIAGNOSIS — M25562 Pain in left knee: Secondary | ICD-10-CM | POA: Diagnosis not present

## 2020-07-30 DIAGNOSIS — M545 Low back pain: Secondary | ICD-10-CM | POA: Diagnosis not present

## 2020-07-30 DIAGNOSIS — M25511 Pain in right shoulder: Secondary | ICD-10-CM | POA: Diagnosis not present

## 2020-07-30 DIAGNOSIS — G8929 Other chronic pain: Secondary | ICD-10-CM | POA: Diagnosis not present

## 2020-08-08 IMAGING — DX DG ABDOMEN 2V
2 series · 2 of 2 positions shown · non-contrast
Comparison: 05/27/2018

CLINICAL DATA: Left upper quadrant pain, colonoscopy earlier this
week

EXAM:
ABDOMEN - 2 VIEW

[abdomen erect]
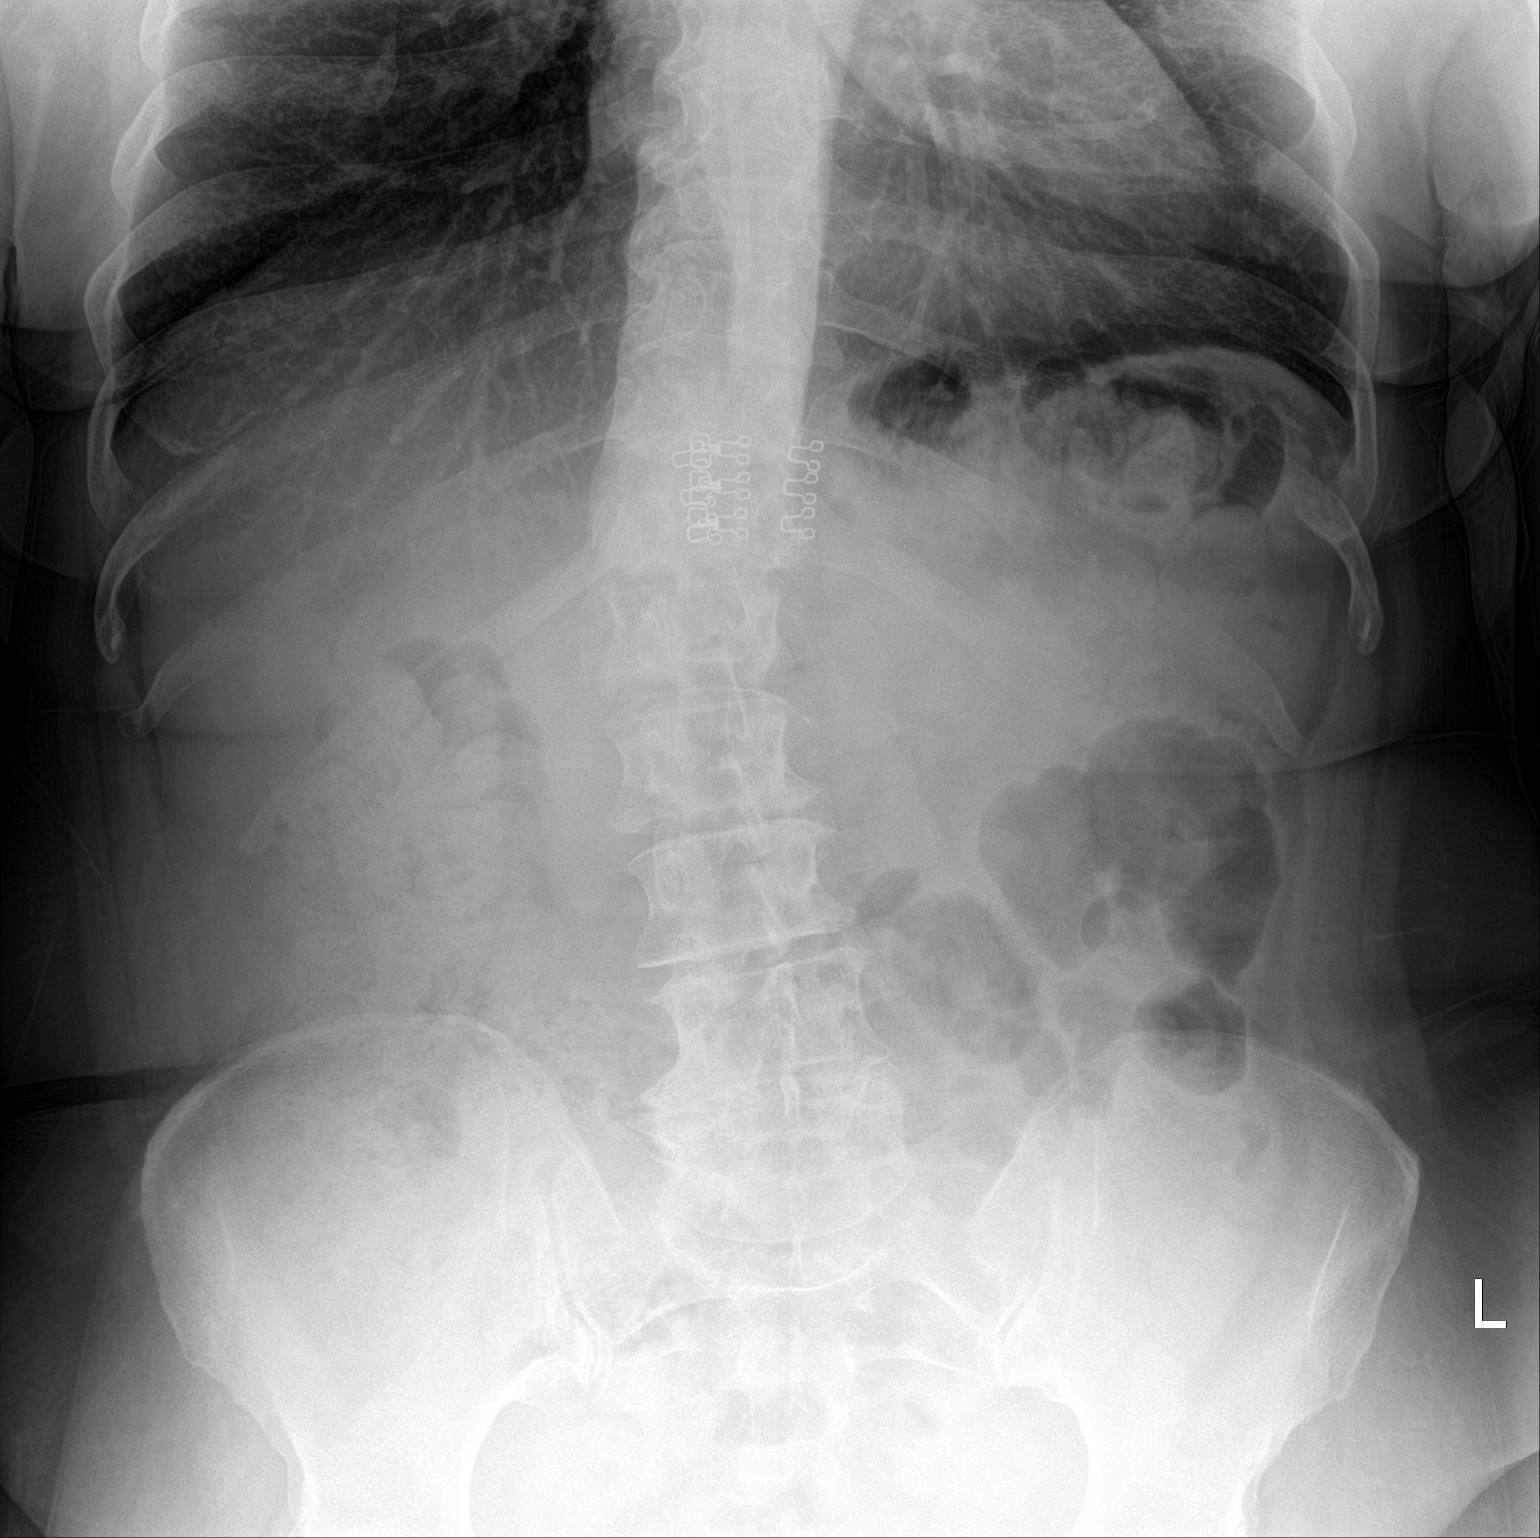

[abdomen supine]
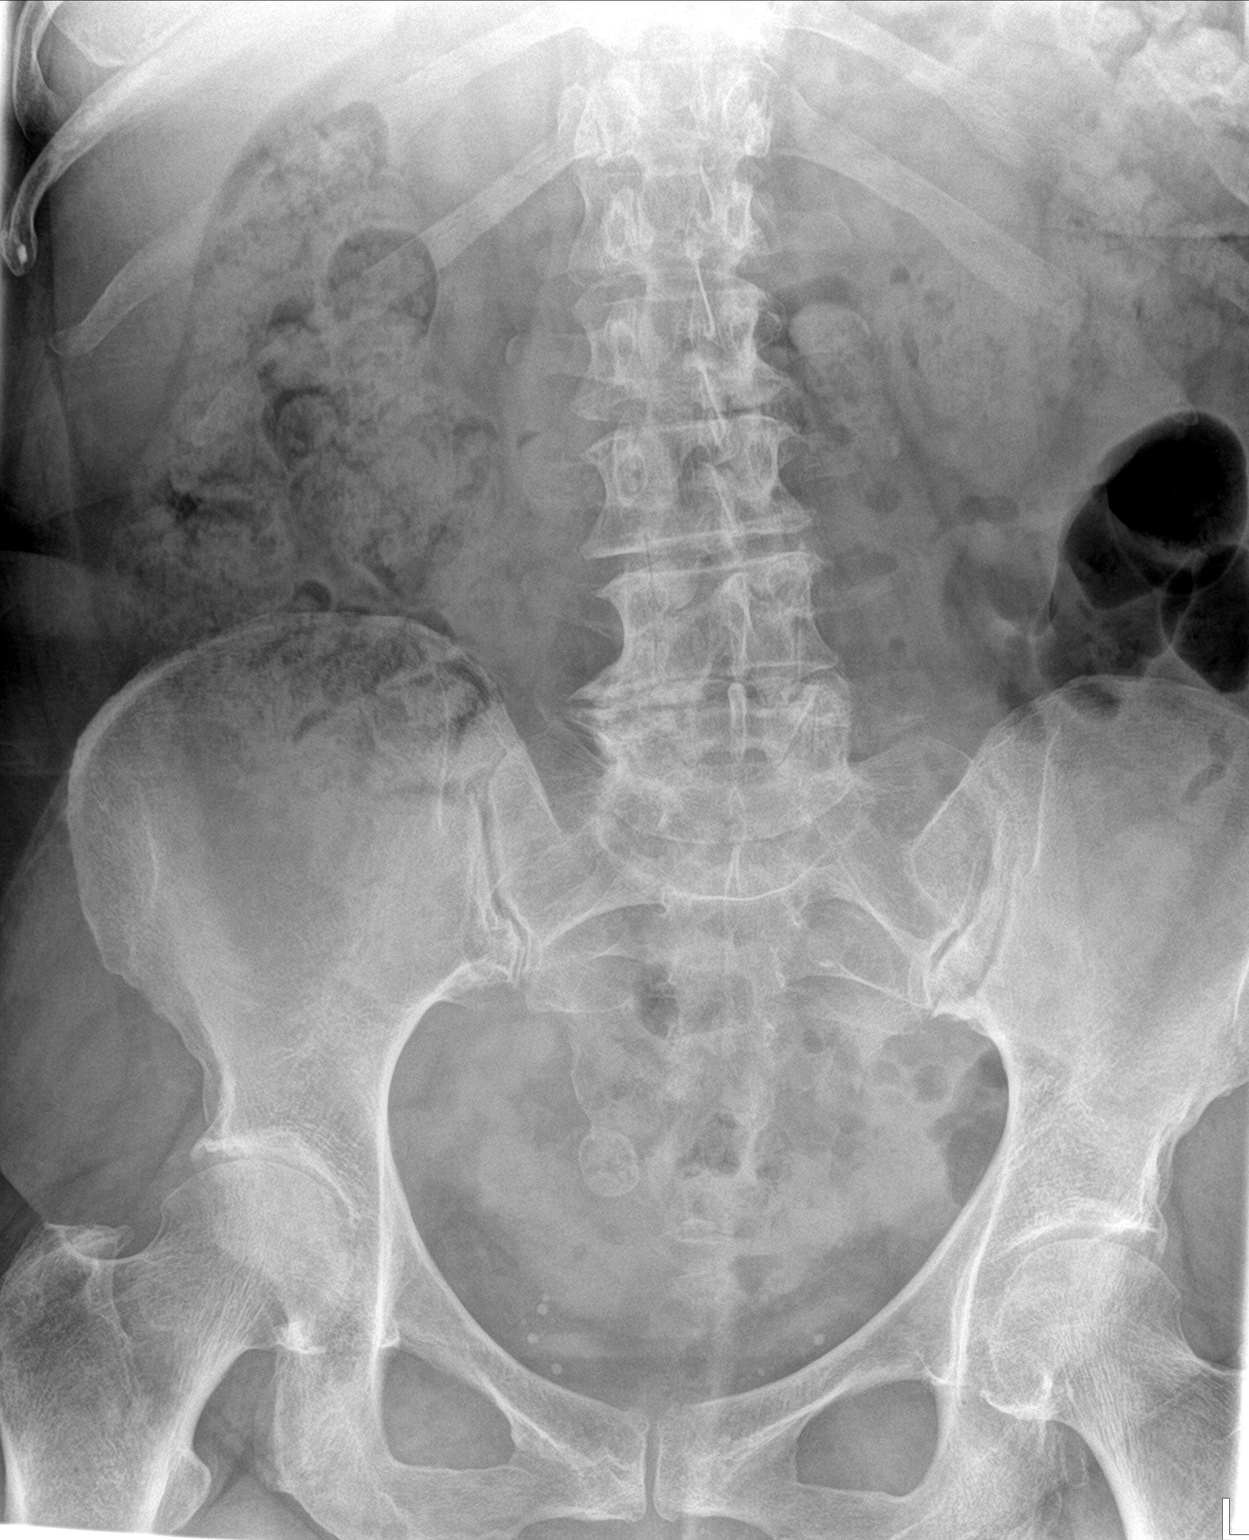

[2 of 2 positions shown; findings below may reference images not displayed]

FINDINGS: Supine and upright frontal views of the abdomen and pelvis are
obtained. No free gas in the greater peritoneal sac. There are no
masses or abnormal calcifications. Bowel gas pattern is unremarkable
without obstruction or ileus. Stable spondylosis within the lower
lumbar spine.
IMPRESSION: 1. Unremarkable bowel gas pattern.

## 2020-09-05 DIAGNOSIS — G894 Chronic pain syndrome: Secondary | ICD-10-CM | POA: Diagnosis not present

## 2020-09-05 DIAGNOSIS — M25512 Pain in left shoulder: Secondary | ICD-10-CM | POA: Diagnosis not present

## 2020-09-05 DIAGNOSIS — M542 Cervicalgia: Secondary | ICD-10-CM | POA: Diagnosis not present

## 2020-09-05 DIAGNOSIS — M25511 Pain in right shoulder: Secondary | ICD-10-CM | POA: Diagnosis not present

## 2020-09-05 DIAGNOSIS — Z79891 Long term (current) use of opiate analgesic: Secondary | ICD-10-CM | POA: Diagnosis not present

## 2020-09-05 DIAGNOSIS — G8929 Other chronic pain: Secondary | ICD-10-CM | POA: Diagnosis not present

## 2020-09-05 DIAGNOSIS — M545 Low back pain, unspecified: Secondary | ICD-10-CM | POA: Diagnosis not present

## 2020-09-05 DIAGNOSIS — M25562 Pain in left knee: Secondary | ICD-10-CM | POA: Diagnosis not present

## 2020-10-01 DIAGNOSIS — M1712 Unilateral primary osteoarthritis, left knee: Secondary | ICD-10-CM | POA: Diagnosis not present

## 2020-10-10 DIAGNOSIS — M25511 Pain in right shoulder: Secondary | ICD-10-CM | POA: Diagnosis not present

## 2020-10-10 DIAGNOSIS — M25512 Pain in left shoulder: Secondary | ICD-10-CM | POA: Diagnosis not present

## 2020-10-10 DIAGNOSIS — G8929 Other chronic pain: Secondary | ICD-10-CM | POA: Diagnosis not present

## 2020-10-10 DIAGNOSIS — G894 Chronic pain syndrome: Secondary | ICD-10-CM | POA: Diagnosis not present

## 2020-10-10 DIAGNOSIS — M542 Cervicalgia: Secondary | ICD-10-CM | POA: Diagnosis not present

## 2020-10-10 DIAGNOSIS — Z79891 Long term (current) use of opiate analgesic: Secondary | ICD-10-CM | POA: Diagnosis not present

## 2020-10-10 DIAGNOSIS — M25562 Pain in left knee: Secondary | ICD-10-CM | POA: Diagnosis not present

## 2020-10-10 DIAGNOSIS — M545 Low back pain, unspecified: Secondary | ICD-10-CM | POA: Diagnosis not present

## 2020-11-21 DIAGNOSIS — J3081 Allergic rhinitis due to animal (cat) (dog) hair and dander: Secondary | ICD-10-CM | POA: Diagnosis not present

## 2020-11-21 DIAGNOSIS — M545 Low back pain, unspecified: Secondary | ICD-10-CM | POA: Diagnosis not present

## 2020-11-21 DIAGNOSIS — M25511 Pain in right shoulder: Secondary | ICD-10-CM | POA: Diagnosis not present

## 2020-11-21 DIAGNOSIS — Z79891 Long term (current) use of opiate analgesic: Secondary | ICD-10-CM | POA: Diagnosis not present

## 2020-11-21 DIAGNOSIS — M25512 Pain in left shoulder: Secondary | ICD-10-CM | POA: Diagnosis not present

## 2020-11-21 DIAGNOSIS — J309 Allergic rhinitis, unspecified: Secondary | ICD-10-CM | POA: Diagnosis not present

## 2020-11-21 DIAGNOSIS — J301 Allergic rhinitis due to pollen: Secondary | ICD-10-CM | POA: Diagnosis not present

## 2020-11-21 DIAGNOSIS — M25562 Pain in left knee: Secondary | ICD-10-CM | POA: Diagnosis not present

## 2020-11-21 DIAGNOSIS — G8929 Other chronic pain: Secondary | ICD-10-CM | POA: Diagnosis not present

## 2020-11-21 DIAGNOSIS — G894 Chronic pain syndrome: Secondary | ICD-10-CM | POA: Diagnosis not present

## 2020-11-21 DIAGNOSIS — J3089 Other allergic rhinitis: Secondary | ICD-10-CM | POA: Diagnosis not present

## 2020-11-21 DIAGNOSIS — M542 Cervicalgia: Secondary | ICD-10-CM | POA: Diagnosis not present

## 2020-11-27 DIAGNOSIS — M25472 Effusion, left ankle: Secondary | ICD-10-CM | POA: Diagnosis not present

## 2020-11-27 DIAGNOSIS — L539 Erythematous condition, unspecified: Secondary | ICD-10-CM | POA: Diagnosis not present

## 2020-11-27 DIAGNOSIS — Z872 Personal history of diseases of the skin and subcutaneous tissue: Secondary | ICD-10-CM | POA: Diagnosis not present

## 2020-11-27 DIAGNOSIS — I872 Venous insufficiency (chronic) (peripheral): Secondary | ICD-10-CM | POA: Diagnosis not present

## 2020-11-27 DIAGNOSIS — I8002 Phlebitis and thrombophlebitis of superficial vessels of left lower extremity: Secondary | ICD-10-CM | POA: Diagnosis not present

## 2020-11-27 DIAGNOSIS — M25572 Pain in left ankle and joints of left foot: Secondary | ICD-10-CM | POA: Diagnosis not present

## 2020-11-27 DIAGNOSIS — M7989 Other specified soft tissue disorders: Secondary | ICD-10-CM | POA: Diagnosis not present

## 2020-11-27 DIAGNOSIS — M79605 Pain in left leg: Secondary | ICD-10-CM | POA: Diagnosis not present

## 2020-12-11 DIAGNOSIS — Z9181 History of falling: Secondary | ICD-10-CM | POA: Diagnosis not present

## 2020-12-11 DIAGNOSIS — Z0000 Encounter for general adult medical examination without abnormal findings: Secondary | ICD-10-CM | POA: Diagnosis not present

## 2020-12-20 DIAGNOSIS — M545 Low back pain, unspecified: Secondary | ICD-10-CM | POA: Diagnosis not present

## 2020-12-20 DIAGNOSIS — J301 Allergic rhinitis due to pollen: Secondary | ICD-10-CM | POA: Diagnosis not present

## 2020-12-20 DIAGNOSIS — J3081 Allergic rhinitis due to animal (cat) (dog) hair and dander: Secondary | ICD-10-CM | POA: Diagnosis not present

## 2020-12-20 DIAGNOSIS — M25511 Pain in right shoulder: Secondary | ICD-10-CM | POA: Diagnosis not present

## 2020-12-20 DIAGNOSIS — J3089 Other allergic rhinitis: Secondary | ICD-10-CM | POA: Diagnosis not present

## 2020-12-20 DIAGNOSIS — Z79891 Long term (current) use of opiate analgesic: Secondary | ICD-10-CM | POA: Diagnosis not present

## 2020-12-20 DIAGNOSIS — G8929 Other chronic pain: Secondary | ICD-10-CM | POA: Diagnosis not present

## 2020-12-20 DIAGNOSIS — R300 Dysuria: Secondary | ICD-10-CM | POA: Diagnosis not present

## 2020-12-20 DIAGNOSIS — M25562 Pain in left knee: Secondary | ICD-10-CM | POA: Diagnosis not present

## 2020-12-20 DIAGNOSIS — M542 Cervicalgia: Secondary | ICD-10-CM | POA: Diagnosis not present

## 2020-12-20 DIAGNOSIS — N390 Urinary tract infection, site not specified: Secondary | ICD-10-CM | POA: Diagnosis not present

## 2020-12-20 DIAGNOSIS — G894 Chronic pain syndrome: Secondary | ICD-10-CM | POA: Diagnosis not present

## 2020-12-20 DIAGNOSIS — J309 Allergic rhinitis, unspecified: Secondary | ICD-10-CM | POA: Diagnosis not present

## 2020-12-20 DIAGNOSIS — M25512 Pain in left shoulder: Secondary | ICD-10-CM | POA: Diagnosis not present

## 2021-01-01 DIAGNOSIS — J0100 Acute maxillary sinusitis, unspecified: Secondary | ICD-10-CM | POA: Diagnosis not present

## 2021-01-01 DIAGNOSIS — R0981 Nasal congestion: Secondary | ICD-10-CM | POA: Diagnosis not present

## 2021-01-14 DIAGNOSIS — H43813 Vitreous degeneration, bilateral: Secondary | ICD-10-CM | POA: Diagnosis not present

## 2021-01-19 DIAGNOSIS — G894 Chronic pain syndrome: Secondary | ICD-10-CM | POA: Diagnosis not present

## 2021-01-21 DIAGNOSIS — Z20822 Contact with and (suspected) exposure to covid-19: Secondary | ICD-10-CM | POA: Diagnosis not present

## 2021-01-21 DIAGNOSIS — Z03818 Encounter for observation for suspected exposure to other biological agents ruled out: Secondary | ICD-10-CM | POA: Diagnosis not present

## 2021-01-22 DIAGNOSIS — J329 Chronic sinusitis, unspecified: Secondary | ICD-10-CM | POA: Diagnosis not present

## 2021-01-24 DIAGNOSIS — M25511 Pain in right shoulder: Secondary | ICD-10-CM | POA: Diagnosis not present

## 2021-01-24 DIAGNOSIS — M545 Low back pain, unspecified: Secondary | ICD-10-CM | POA: Diagnosis not present

## 2021-01-24 DIAGNOSIS — G894 Chronic pain syndrome: Secondary | ICD-10-CM | POA: Diagnosis not present

## 2021-01-24 DIAGNOSIS — J309 Allergic rhinitis, unspecified: Secondary | ICD-10-CM | POA: Diagnosis not present

## 2021-01-24 DIAGNOSIS — M25512 Pain in left shoulder: Secondary | ICD-10-CM | POA: Diagnosis not present

## 2021-01-24 DIAGNOSIS — J301 Allergic rhinitis due to pollen: Secondary | ICD-10-CM | POA: Diagnosis not present

## 2021-01-24 DIAGNOSIS — M25562 Pain in left knee: Secondary | ICD-10-CM | POA: Diagnosis not present

## 2021-01-24 DIAGNOSIS — G8929 Other chronic pain: Secondary | ICD-10-CM | POA: Diagnosis not present

## 2021-01-24 DIAGNOSIS — M542 Cervicalgia: Secondary | ICD-10-CM | POA: Diagnosis not present

## 2021-01-24 DIAGNOSIS — Z79891 Long term (current) use of opiate analgesic: Secondary | ICD-10-CM | POA: Diagnosis not present

## 2021-01-24 DIAGNOSIS — J3081 Allergic rhinitis due to animal (cat) (dog) hair and dander: Secondary | ICD-10-CM | POA: Diagnosis not present

## 2021-01-24 DIAGNOSIS — J3089 Other allergic rhinitis: Secondary | ICD-10-CM | POA: Diagnosis not present

## 2021-01-27 DIAGNOSIS — H43813 Vitreous degeneration, bilateral: Secondary | ICD-10-CM | POA: Diagnosis not present

## 2021-02-18 DIAGNOSIS — G894 Chronic pain syndrome: Secondary | ICD-10-CM | POA: Diagnosis not present

## 2021-02-21 DIAGNOSIS — M545 Low back pain, unspecified: Secondary | ICD-10-CM | POA: Diagnosis not present

## 2021-02-21 DIAGNOSIS — G894 Chronic pain syndrome: Secondary | ICD-10-CM | POA: Diagnosis not present

## 2021-02-21 DIAGNOSIS — J309 Allergic rhinitis, unspecified: Secondary | ICD-10-CM | POA: Diagnosis not present

## 2021-02-21 DIAGNOSIS — J301 Allergic rhinitis due to pollen: Secondary | ICD-10-CM | POA: Diagnosis not present

## 2021-02-21 DIAGNOSIS — M25562 Pain in left knee: Secondary | ICD-10-CM | POA: Diagnosis not present

## 2021-02-21 DIAGNOSIS — M25512 Pain in left shoulder: Secondary | ICD-10-CM | POA: Diagnosis not present

## 2021-02-21 DIAGNOSIS — J3089 Other allergic rhinitis: Secondary | ICD-10-CM | POA: Diagnosis not present

## 2021-02-21 DIAGNOSIS — J3081 Allergic rhinitis due to animal (cat) (dog) hair and dander: Secondary | ICD-10-CM | POA: Diagnosis not present

## 2021-02-21 DIAGNOSIS — G8929 Other chronic pain: Secondary | ICD-10-CM | POA: Diagnosis not present

## 2021-02-21 DIAGNOSIS — Z79891 Long term (current) use of opiate analgesic: Secondary | ICD-10-CM | POA: Diagnosis not present

## 2021-02-21 DIAGNOSIS — M542 Cervicalgia: Secondary | ICD-10-CM | POA: Diagnosis not present

## 2021-02-21 DIAGNOSIS — M25511 Pain in right shoulder: Secondary | ICD-10-CM | POA: Diagnosis not present

## 2021-02-25 DIAGNOSIS — Z23 Encounter for immunization: Secondary | ICD-10-CM | POA: Diagnosis not present

## 2021-02-25 DIAGNOSIS — E039 Hypothyroidism, unspecified: Secondary | ICD-10-CM | POA: Diagnosis not present

## 2021-02-25 DIAGNOSIS — R5383 Other fatigue: Secondary | ICD-10-CM | POA: Diagnosis not present

## 2021-03-10 DIAGNOSIS — N3001 Acute cystitis with hematuria: Secondary | ICD-10-CM | POA: Diagnosis not present

## 2021-03-10 DIAGNOSIS — R319 Hematuria, unspecified: Secondary | ICD-10-CM | POA: Diagnosis not present

## 2021-03-10 DIAGNOSIS — N3090 Cystitis, unspecified without hematuria: Secondary | ICD-10-CM | POA: Diagnosis not present

## 2021-03-10 DIAGNOSIS — R519 Headache, unspecified: Secondary | ICD-10-CM | POA: Diagnosis not present

## 2021-03-10 DIAGNOSIS — R300 Dysuria: Secondary | ICD-10-CM | POA: Diagnosis not present

## 2021-03-20 DIAGNOSIS — G894 Chronic pain syndrome: Secondary | ICD-10-CM | POA: Diagnosis not present

## 2021-03-28 DIAGNOSIS — J329 Chronic sinusitis, unspecified: Secondary | ICD-10-CM | POA: Diagnosis not present

## 2021-03-28 DIAGNOSIS — R0981 Nasal congestion: Secondary | ICD-10-CM | POA: Diagnosis not present

## 2021-03-28 DIAGNOSIS — J3489 Other specified disorders of nose and nasal sinuses: Secondary | ICD-10-CM | POA: Diagnosis not present

## 2021-03-28 DIAGNOSIS — J343 Hypertrophy of nasal turbinates: Secondary | ICD-10-CM | POA: Diagnosis not present

## 2021-03-28 DIAGNOSIS — J342 Deviated nasal septum: Secondary | ICD-10-CM | POA: Diagnosis not present

## 2021-04-11 DIAGNOSIS — M545 Low back pain, unspecified: Secondary | ICD-10-CM | POA: Diagnosis not present

## 2021-04-11 DIAGNOSIS — G894 Chronic pain syndrome: Secondary | ICD-10-CM | POA: Diagnosis not present

## 2021-04-11 DIAGNOSIS — J3089 Other allergic rhinitis: Secondary | ICD-10-CM | POA: Diagnosis not present

## 2021-04-11 DIAGNOSIS — M25512 Pain in left shoulder: Secondary | ICD-10-CM | POA: Diagnosis not present

## 2021-04-11 DIAGNOSIS — M25511 Pain in right shoulder: Secondary | ICD-10-CM | POA: Diagnosis not present

## 2021-04-11 DIAGNOSIS — M542 Cervicalgia: Secondary | ICD-10-CM | POA: Diagnosis not present

## 2021-04-11 DIAGNOSIS — J301 Allergic rhinitis due to pollen: Secondary | ICD-10-CM | POA: Diagnosis not present

## 2021-04-11 DIAGNOSIS — J309 Allergic rhinitis, unspecified: Secondary | ICD-10-CM | POA: Diagnosis not present

## 2021-04-11 DIAGNOSIS — Z79891 Long term (current) use of opiate analgesic: Secondary | ICD-10-CM | POA: Diagnosis not present

## 2021-04-11 DIAGNOSIS — M25562 Pain in left knee: Secondary | ICD-10-CM | POA: Diagnosis not present

## 2021-04-11 DIAGNOSIS — G8929 Other chronic pain: Secondary | ICD-10-CM | POA: Diagnosis not present

## 2021-04-11 DIAGNOSIS — J3081 Allergic rhinitis due to animal (cat) (dog) hair and dander: Secondary | ICD-10-CM | POA: Diagnosis not present

## 2021-04-19 DIAGNOSIS — G894 Chronic pain syndrome: Secondary | ICD-10-CM | POA: Diagnosis not present

## 2021-04-23 DIAGNOSIS — J3489 Other specified disorders of nose and nasal sinuses: Secondary | ICD-10-CM | POA: Diagnosis not present

## 2021-04-23 DIAGNOSIS — J329 Chronic sinusitis, unspecified: Secondary | ICD-10-CM | POA: Diagnosis not present

## 2021-05-02 DIAGNOSIS — M25512 Pain in left shoulder: Secondary | ICD-10-CM | POA: Diagnosis not present

## 2021-05-02 DIAGNOSIS — G894 Chronic pain syndrome: Secondary | ICD-10-CM | POA: Diagnosis not present

## 2021-05-02 DIAGNOSIS — M545 Low back pain, unspecified: Secondary | ICD-10-CM | POA: Diagnosis not present

## 2021-05-02 DIAGNOSIS — Z79891 Long term (current) use of opiate analgesic: Secondary | ICD-10-CM | POA: Diagnosis not present

## 2021-05-02 DIAGNOSIS — J3081 Allergic rhinitis due to animal (cat) (dog) hair and dander: Secondary | ICD-10-CM | POA: Diagnosis not present

## 2021-05-02 DIAGNOSIS — J309 Allergic rhinitis, unspecified: Secondary | ICD-10-CM | POA: Diagnosis not present

## 2021-05-02 DIAGNOSIS — J3089 Other allergic rhinitis: Secondary | ICD-10-CM | POA: Diagnosis not present

## 2021-05-02 DIAGNOSIS — J301 Allergic rhinitis due to pollen: Secondary | ICD-10-CM | POA: Diagnosis not present

## 2021-05-02 DIAGNOSIS — G8929 Other chronic pain: Secondary | ICD-10-CM | POA: Diagnosis not present

## 2021-05-02 DIAGNOSIS — M25562 Pain in left knee: Secondary | ICD-10-CM | POA: Diagnosis not present

## 2021-05-02 DIAGNOSIS — M542 Cervicalgia: Secondary | ICD-10-CM | POA: Diagnosis not present

## 2021-05-02 DIAGNOSIS — M25511 Pain in right shoulder: Secondary | ICD-10-CM | POA: Diagnosis not present

## 2021-05-16 DIAGNOSIS — R7303 Prediabetes: Secondary | ICD-10-CM | POA: Diagnosis not present

## 2021-05-19 DIAGNOSIS — G894 Chronic pain syndrome: Secondary | ICD-10-CM | POA: Diagnosis not present

## 2021-05-20 DIAGNOSIS — R0981 Nasal congestion: Secondary | ICD-10-CM | POA: Diagnosis not present

## 2021-05-20 DIAGNOSIS — J342 Deviated nasal septum: Secondary | ICD-10-CM | POA: Diagnosis not present

## 2021-05-20 DIAGNOSIS — J343 Hypertrophy of nasal turbinates: Secondary | ICD-10-CM | POA: Diagnosis not present

## 2021-05-20 DIAGNOSIS — J320 Chronic maxillary sinusitis: Secondary | ICD-10-CM | POA: Diagnosis not present

## 2021-05-30 DIAGNOSIS — M545 Low back pain, unspecified: Secondary | ICD-10-CM | POA: Diagnosis not present

## 2021-05-30 DIAGNOSIS — J309 Allergic rhinitis, unspecified: Secondary | ICD-10-CM | POA: Diagnosis not present

## 2021-05-30 DIAGNOSIS — J3081 Allergic rhinitis due to animal (cat) (dog) hair and dander: Secondary | ICD-10-CM | POA: Diagnosis not present

## 2021-05-30 DIAGNOSIS — M25512 Pain in left shoulder: Secondary | ICD-10-CM | POA: Diagnosis not present

## 2021-05-30 DIAGNOSIS — Z79891 Long term (current) use of opiate analgesic: Secondary | ICD-10-CM | POA: Diagnosis not present

## 2021-05-30 DIAGNOSIS — M542 Cervicalgia: Secondary | ICD-10-CM | POA: Diagnosis not present

## 2021-05-30 DIAGNOSIS — G894 Chronic pain syndrome: Secondary | ICD-10-CM | POA: Diagnosis not present

## 2021-05-30 DIAGNOSIS — M25561 Pain in right knee: Secondary | ICD-10-CM | POA: Diagnosis not present

## 2021-05-30 DIAGNOSIS — M25511 Pain in right shoulder: Secondary | ICD-10-CM | POA: Diagnosis not present

## 2021-05-30 DIAGNOSIS — J301 Allergic rhinitis due to pollen: Secondary | ICD-10-CM | POA: Diagnosis not present

## 2021-05-30 DIAGNOSIS — J3089 Other allergic rhinitis: Secondary | ICD-10-CM | POA: Diagnosis not present

## 2021-05-30 DIAGNOSIS — G8929 Other chronic pain: Secondary | ICD-10-CM | POA: Diagnosis not present

## 2021-06-13 DIAGNOSIS — R7303 Prediabetes: Secondary | ICD-10-CM | POA: Diagnosis not present

## 2021-06-13 DIAGNOSIS — I8393 Asymptomatic varicose veins of bilateral lower extremities: Secondary | ICD-10-CM | POA: Diagnosis not present

## 2021-06-18 DIAGNOSIS — E559 Vitamin D deficiency, unspecified: Secondary | ICD-10-CM | POA: Diagnosis not present

## 2021-06-18 DIAGNOSIS — R5383 Other fatigue: Secondary | ICD-10-CM | POA: Diagnosis not present

## 2021-06-18 DIAGNOSIS — G894 Chronic pain syndrome: Secondary | ICD-10-CM | POA: Diagnosis not present

## 2021-06-27 DIAGNOSIS — M545 Low back pain, unspecified: Secondary | ICD-10-CM | POA: Diagnosis not present

## 2021-06-27 DIAGNOSIS — J3081 Allergic rhinitis due to animal (cat) (dog) hair and dander: Secondary | ICD-10-CM | POA: Diagnosis not present

## 2021-06-27 DIAGNOSIS — J3089 Other allergic rhinitis: Secondary | ICD-10-CM | POA: Diagnosis not present

## 2021-06-27 DIAGNOSIS — M25512 Pain in left shoulder: Secondary | ICD-10-CM | POA: Diagnosis not present

## 2021-06-27 DIAGNOSIS — J301 Allergic rhinitis due to pollen: Secondary | ICD-10-CM | POA: Diagnosis not present

## 2021-06-27 DIAGNOSIS — Z79891 Long term (current) use of opiate analgesic: Secondary | ICD-10-CM | POA: Diagnosis not present

## 2021-06-27 DIAGNOSIS — M25562 Pain in left knee: Secondary | ICD-10-CM | POA: Diagnosis not present

## 2021-06-27 DIAGNOSIS — G894 Chronic pain syndrome: Secondary | ICD-10-CM | POA: Diagnosis not present

## 2021-06-27 DIAGNOSIS — M25511 Pain in right shoulder: Secondary | ICD-10-CM | POA: Diagnosis not present

## 2021-06-27 DIAGNOSIS — J309 Allergic rhinitis, unspecified: Secondary | ICD-10-CM | POA: Diagnosis not present

## 2021-06-27 DIAGNOSIS — M542 Cervicalgia: Secondary | ICD-10-CM | POA: Diagnosis not present

## 2021-07-16 DIAGNOSIS — M25512 Pain in left shoulder: Secondary | ICD-10-CM | POA: Diagnosis not present

## 2021-07-16 DIAGNOSIS — Z9889 Other specified postprocedural states: Secondary | ICD-10-CM | POA: Diagnosis not present

## 2021-07-16 DIAGNOSIS — M19012 Primary osteoarthritis, left shoulder: Secondary | ICD-10-CM | POA: Diagnosis not present

## 2021-07-18 DIAGNOSIS — G894 Chronic pain syndrome: Secondary | ICD-10-CM | POA: Diagnosis not present

## 2021-07-28 DIAGNOSIS — M25512 Pain in left shoulder: Secondary | ICD-10-CM | POA: Diagnosis not present

## 2021-07-28 DIAGNOSIS — S46112A Strain of muscle, fascia and tendon of long head of biceps, left arm, initial encounter: Secondary | ICD-10-CM | POA: Diagnosis not present

## 2021-07-28 DIAGNOSIS — M25412 Effusion, left shoulder: Secondary | ICD-10-CM | POA: Diagnosis not present

## 2021-07-28 DIAGNOSIS — S46012A Strain of muscle(s) and tendon(s) of the rotator cuff of left shoulder, initial encounter: Secondary | ICD-10-CM | POA: Diagnosis not present

## 2021-08-01 DIAGNOSIS — M25512 Pain in left shoulder: Secondary | ICD-10-CM | POA: Diagnosis not present

## 2021-08-01 DIAGNOSIS — G8929 Other chronic pain: Secondary | ICD-10-CM | POA: Diagnosis not present

## 2021-08-06 DIAGNOSIS — M25512 Pain in left shoulder: Secondary | ICD-10-CM | POA: Diagnosis not present

## 2021-08-08 DIAGNOSIS — J3089 Other allergic rhinitis: Secondary | ICD-10-CM | POA: Diagnosis not present

## 2021-08-08 DIAGNOSIS — M545 Low back pain, unspecified: Secondary | ICD-10-CM | POA: Diagnosis not present

## 2021-08-08 DIAGNOSIS — M25562 Pain in left knee: Secondary | ICD-10-CM | POA: Diagnosis not present

## 2021-08-08 DIAGNOSIS — J301 Allergic rhinitis due to pollen: Secondary | ICD-10-CM | POA: Diagnosis not present

## 2021-08-08 DIAGNOSIS — M542 Cervicalgia: Secondary | ICD-10-CM | POA: Diagnosis not present

## 2021-08-08 DIAGNOSIS — M25511 Pain in right shoulder: Secondary | ICD-10-CM | POA: Diagnosis not present

## 2021-08-08 DIAGNOSIS — J309 Allergic rhinitis, unspecified: Secondary | ICD-10-CM | POA: Diagnosis not present

## 2021-08-08 DIAGNOSIS — G894 Chronic pain syndrome: Secondary | ICD-10-CM | POA: Diagnosis not present

## 2021-08-08 DIAGNOSIS — J3081 Allergic rhinitis due to animal (cat) (dog) hair and dander: Secondary | ICD-10-CM | POA: Diagnosis not present

## 2021-08-08 DIAGNOSIS — Z79891 Long term (current) use of opiate analgesic: Secondary | ICD-10-CM | POA: Diagnosis not present

## 2021-08-17 DIAGNOSIS — G894 Chronic pain syndrome: Secondary | ICD-10-CM | POA: Diagnosis not present

## 2021-08-26 DIAGNOSIS — E538 Deficiency of other specified B group vitamins: Secondary | ICD-10-CM | POA: Diagnosis not present

## 2021-08-26 DIAGNOSIS — Z2821 Immunization not carried out because of patient refusal: Secondary | ICD-10-CM | POA: Diagnosis not present

## 2021-08-26 DIAGNOSIS — R7303 Prediabetes: Secondary | ICD-10-CM | POA: Diagnosis not present

## 2021-08-26 DIAGNOSIS — E039 Hypothyroidism, unspecified: Secondary | ICD-10-CM | POA: Diagnosis not present

## 2021-08-26 DIAGNOSIS — E559 Vitamin D deficiency, unspecified: Secondary | ICD-10-CM | POA: Diagnosis not present

## 2021-09-05 DIAGNOSIS — J301 Allergic rhinitis due to pollen: Secondary | ICD-10-CM | POA: Diagnosis not present

## 2021-09-05 DIAGNOSIS — J3089 Other allergic rhinitis: Secondary | ICD-10-CM | POA: Diagnosis not present

## 2021-09-05 DIAGNOSIS — M542 Cervicalgia: Secondary | ICD-10-CM | POA: Diagnosis not present

## 2021-09-05 DIAGNOSIS — M545 Low back pain, unspecified: Secondary | ICD-10-CM | POA: Diagnosis not present

## 2021-09-05 DIAGNOSIS — M25562 Pain in left knee: Secondary | ICD-10-CM | POA: Diagnosis not present

## 2021-09-05 DIAGNOSIS — Z79891 Long term (current) use of opiate analgesic: Secondary | ICD-10-CM | POA: Diagnosis not present

## 2021-09-05 DIAGNOSIS — J3081 Allergic rhinitis due to animal (cat) (dog) hair and dander: Secondary | ICD-10-CM | POA: Diagnosis not present

## 2021-09-05 DIAGNOSIS — M25512 Pain in left shoulder: Secondary | ICD-10-CM | POA: Diagnosis not present

## 2021-09-05 DIAGNOSIS — M25511 Pain in right shoulder: Secondary | ICD-10-CM | POA: Diagnosis not present

## 2021-09-05 DIAGNOSIS — G894 Chronic pain syndrome: Secondary | ICD-10-CM | POA: Diagnosis not present

## 2021-09-05 DIAGNOSIS — J309 Allergic rhinitis, unspecified: Secondary | ICD-10-CM | POA: Diagnosis not present

## 2021-09-14 DIAGNOSIS — R509 Fever, unspecified: Secondary | ICD-10-CM | POA: Diagnosis not present

## 2021-09-14 DIAGNOSIS — R051 Acute cough: Secondary | ICD-10-CM | POA: Diagnosis not present

## 2021-10-09 DIAGNOSIS — G894 Chronic pain syndrome: Secondary | ICD-10-CM | POA: Diagnosis not present

## 2021-10-09 DIAGNOSIS — M25512 Pain in left shoulder: Secondary | ICD-10-CM | POA: Diagnosis not present

## 2021-10-09 DIAGNOSIS — Z79891 Long term (current) use of opiate analgesic: Secondary | ICD-10-CM | POA: Diagnosis not present

## 2021-10-09 DIAGNOSIS — J3089 Other allergic rhinitis: Secondary | ICD-10-CM | POA: Diagnosis not present

## 2021-10-09 DIAGNOSIS — J3081 Allergic rhinitis due to animal (cat) (dog) hair and dander: Secondary | ICD-10-CM | POA: Diagnosis not present

## 2021-10-09 DIAGNOSIS — J301 Allergic rhinitis due to pollen: Secondary | ICD-10-CM | POA: Diagnosis not present

## 2021-10-09 DIAGNOSIS — M542 Cervicalgia: Secondary | ICD-10-CM | POA: Diagnosis not present

## 2021-10-09 DIAGNOSIS — M25562 Pain in left knee: Secondary | ICD-10-CM | POA: Diagnosis not present

## 2021-10-09 DIAGNOSIS — M25511 Pain in right shoulder: Secondary | ICD-10-CM | POA: Diagnosis not present

## 2021-10-09 DIAGNOSIS — M545 Low back pain, unspecified: Secondary | ICD-10-CM | POA: Diagnosis not present

## 2021-11-18 DIAGNOSIS — M25512 Pain in left shoulder: Secondary | ICD-10-CM | POA: Diagnosis not present

## 2021-11-18 DIAGNOSIS — M25511 Pain in right shoulder: Secondary | ICD-10-CM | POA: Diagnosis not present

## 2021-11-18 DIAGNOSIS — Z79891 Long term (current) use of opiate analgesic: Secondary | ICD-10-CM | POA: Diagnosis not present

## 2021-11-18 DIAGNOSIS — J301 Allergic rhinitis due to pollen: Secondary | ICD-10-CM | POA: Diagnosis not present

## 2021-11-18 DIAGNOSIS — M25562 Pain in left knee: Secondary | ICD-10-CM | POA: Diagnosis not present

## 2021-11-18 DIAGNOSIS — M545 Low back pain, unspecified: Secondary | ICD-10-CM | POA: Diagnosis not present

## 2021-11-18 DIAGNOSIS — G894 Chronic pain syndrome: Secondary | ICD-10-CM | POA: Diagnosis not present

## 2021-11-18 DIAGNOSIS — M542 Cervicalgia: Secondary | ICD-10-CM | POA: Diagnosis not present

## 2021-11-18 DIAGNOSIS — J3081 Allergic rhinitis due to animal (cat) (dog) hair and dander: Secondary | ICD-10-CM | POA: Diagnosis not present

## 2021-12-01 DIAGNOSIS — I8393 Asymptomatic varicose veins of bilateral lower extremities: Secondary | ICD-10-CM | POA: Diagnosis not present

## 2021-12-01 DIAGNOSIS — R7303 Prediabetes: Secondary | ICD-10-CM | POA: Diagnosis not present

## 2021-12-18 DIAGNOSIS — G894 Chronic pain syndrome: Secondary | ICD-10-CM | POA: Diagnosis not present

## 2021-12-18 DIAGNOSIS — M542 Cervicalgia: Secondary | ICD-10-CM | POA: Diagnosis not present

## 2021-12-18 DIAGNOSIS — J3089 Other allergic rhinitis: Secondary | ICD-10-CM | POA: Diagnosis not present

## 2021-12-18 DIAGNOSIS — Z79891 Long term (current) use of opiate analgesic: Secondary | ICD-10-CM | POA: Diagnosis not present

## 2021-12-18 DIAGNOSIS — M25511 Pain in right shoulder: Secondary | ICD-10-CM | POA: Diagnosis not present

## 2021-12-18 DIAGNOSIS — M545 Low back pain, unspecified: Secondary | ICD-10-CM | POA: Diagnosis not present

## 2021-12-18 DIAGNOSIS — M25512 Pain in left shoulder: Secondary | ICD-10-CM | POA: Diagnosis not present

## 2021-12-18 DIAGNOSIS — M5137 Other intervertebral disc degeneration, lumbosacral region: Secondary | ICD-10-CM | POA: Diagnosis not present

## 2021-12-18 DIAGNOSIS — J301 Allergic rhinitis due to pollen: Secondary | ICD-10-CM | POA: Diagnosis not present

## 2021-12-18 DIAGNOSIS — J3081 Allergic rhinitis due to animal (cat) (dog) hair and dander: Secondary | ICD-10-CM | POA: Diagnosis not present

## 2021-12-18 DIAGNOSIS — M25562 Pain in left knee: Secondary | ICD-10-CM | POA: Diagnosis not present

## 2022-01-20 DIAGNOSIS — G894 Chronic pain syndrome: Secondary | ICD-10-CM | POA: Diagnosis not present

## 2022-01-20 DIAGNOSIS — M25562 Pain in left knee: Secondary | ICD-10-CM | POA: Diagnosis not present

## 2022-01-20 DIAGNOSIS — J301 Allergic rhinitis due to pollen: Secondary | ICD-10-CM | POA: Diagnosis not present

## 2022-01-20 DIAGNOSIS — M5137 Other intervertebral disc degeneration, lumbosacral region: Secondary | ICD-10-CM | POA: Diagnosis not present

## 2022-01-20 DIAGNOSIS — Z79891 Long term (current) use of opiate analgesic: Secondary | ICD-10-CM | POA: Diagnosis not present

## 2022-01-20 DIAGNOSIS — M545 Low back pain, unspecified: Secondary | ICD-10-CM | POA: Diagnosis not present

## 2022-01-20 DIAGNOSIS — M25511 Pain in right shoulder: Secondary | ICD-10-CM | POA: Diagnosis not present

## 2022-01-20 DIAGNOSIS — M25512 Pain in left shoulder: Secondary | ICD-10-CM | POA: Diagnosis not present

## 2022-01-20 DIAGNOSIS — M542 Cervicalgia: Secondary | ICD-10-CM | POA: Diagnosis not present

## 2022-01-20 DIAGNOSIS — J3081 Allergic rhinitis due to animal (cat) (dog) hair and dander: Secondary | ICD-10-CM | POA: Diagnosis not present

## 2022-01-20 DIAGNOSIS — J3089 Other allergic rhinitis: Secondary | ICD-10-CM | POA: Diagnosis not present

## 2022-02-16 DIAGNOSIS — M5136 Other intervertebral disc degeneration, lumbar region: Secondary | ICD-10-CM | POA: Diagnosis not present

## 2022-02-16 DIAGNOSIS — M25562 Pain in left knee: Secondary | ICD-10-CM | POA: Diagnosis not present

## 2022-02-16 DIAGNOSIS — Z79891 Long term (current) use of opiate analgesic: Secondary | ICD-10-CM | POA: Diagnosis not present

## 2022-02-16 DIAGNOSIS — G894 Chronic pain syndrome: Secondary | ICD-10-CM | POA: Diagnosis not present

## 2022-02-16 DIAGNOSIS — M545 Low back pain, unspecified: Secondary | ICD-10-CM | POA: Diagnosis not present

## 2022-02-16 DIAGNOSIS — M5137 Other intervertebral disc degeneration, lumbosacral region: Secondary | ICD-10-CM | POA: Diagnosis not present

## 2022-02-16 DIAGNOSIS — M542 Cervicalgia: Secondary | ICD-10-CM | POA: Diagnosis not present

## 2022-02-16 DIAGNOSIS — J3089 Other allergic rhinitis: Secondary | ICD-10-CM | POA: Diagnosis not present

## 2022-02-16 DIAGNOSIS — M25512 Pain in left shoulder: Secondary | ICD-10-CM | POA: Diagnosis not present

## 2022-02-16 DIAGNOSIS — M25511 Pain in right shoulder: Secondary | ICD-10-CM | POA: Diagnosis not present

## 2022-02-16 DIAGNOSIS — J301 Allergic rhinitis due to pollen: Secondary | ICD-10-CM | POA: Diagnosis not present

## 2022-03-04 DIAGNOSIS — I10 Essential (primary) hypertension: Secondary | ICD-10-CM | POA: Diagnosis not present

## 2022-03-04 DIAGNOSIS — E785 Hyperlipidemia, unspecified: Secondary | ICD-10-CM | POA: Diagnosis not present

## 2022-03-04 DIAGNOSIS — R609 Edema, unspecified: Secondary | ICD-10-CM | POA: Diagnosis not present

## 2022-03-04 DIAGNOSIS — E559 Vitamin D deficiency, unspecified: Secondary | ICD-10-CM | POA: Diagnosis not present

## 2022-03-04 DIAGNOSIS — R7303 Prediabetes: Secondary | ICD-10-CM | POA: Diagnosis not present

## 2022-03-04 DIAGNOSIS — E039 Hypothyroidism, unspecified: Secondary | ICD-10-CM | POA: Diagnosis not present

## 2022-03-13 DIAGNOSIS — J301 Allergic rhinitis due to pollen: Secondary | ICD-10-CM | POA: Diagnosis not present

## 2022-03-13 DIAGNOSIS — G894 Chronic pain syndrome: Secondary | ICD-10-CM | POA: Diagnosis not present

## 2022-03-13 DIAGNOSIS — J3089 Other allergic rhinitis: Secondary | ICD-10-CM | POA: Diagnosis not present

## 2022-03-13 DIAGNOSIS — M25512 Pain in left shoulder: Secondary | ICD-10-CM | POA: Diagnosis not present

## 2022-03-13 DIAGNOSIS — M5137 Other intervertebral disc degeneration, lumbosacral region: Secondary | ICD-10-CM | POA: Diagnosis not present

## 2022-03-13 DIAGNOSIS — Z79891 Long term (current) use of opiate analgesic: Secondary | ICD-10-CM | POA: Diagnosis not present

## 2022-03-13 DIAGNOSIS — M542 Cervicalgia: Secondary | ICD-10-CM | POA: Diagnosis not present

## 2022-03-13 DIAGNOSIS — M545 Low back pain, unspecified: Secondary | ICD-10-CM | POA: Diagnosis not present

## 2022-03-13 DIAGNOSIS — M25562 Pain in left knee: Secondary | ICD-10-CM | POA: Diagnosis not present

## 2022-03-13 DIAGNOSIS — M25511 Pain in right shoulder: Secondary | ICD-10-CM | POA: Diagnosis not present

## 2022-03-13 DIAGNOSIS — J3081 Allergic rhinitis due to animal (cat) (dog) hair and dander: Secondary | ICD-10-CM | POA: Diagnosis not present

## 2022-04-24 DIAGNOSIS — M25562 Pain in left knee: Secondary | ICD-10-CM | POA: Diagnosis not present

## 2022-04-24 DIAGNOSIS — M25512 Pain in left shoulder: Secondary | ICD-10-CM | POA: Diagnosis not present

## 2022-04-24 DIAGNOSIS — J3081 Allergic rhinitis due to animal (cat) (dog) hair and dander: Secondary | ICD-10-CM | POA: Diagnosis not present

## 2022-04-24 DIAGNOSIS — M5137 Other intervertebral disc degeneration, lumbosacral region: Secondary | ICD-10-CM | POA: Diagnosis not present

## 2022-04-24 DIAGNOSIS — G894 Chronic pain syndrome: Secondary | ICD-10-CM | POA: Diagnosis not present

## 2022-04-24 DIAGNOSIS — M542 Cervicalgia: Secondary | ICD-10-CM | POA: Diagnosis not present

## 2022-04-24 DIAGNOSIS — J3089 Other allergic rhinitis: Secondary | ICD-10-CM | POA: Diagnosis not present

## 2022-04-24 DIAGNOSIS — M25611 Stiffness of right shoulder, not elsewhere classified: Secondary | ICD-10-CM | POA: Diagnosis not present

## 2022-04-24 DIAGNOSIS — J301 Allergic rhinitis due to pollen: Secondary | ICD-10-CM | POA: Diagnosis not present

## 2022-04-24 DIAGNOSIS — M545 Low back pain, unspecified: Secondary | ICD-10-CM | POA: Diagnosis not present

## 2022-04-24 DIAGNOSIS — Z79891 Long term (current) use of opiate analgesic: Secondary | ICD-10-CM | POA: Diagnosis not present

## 2022-05-06 DIAGNOSIS — E559 Vitamin D deficiency, unspecified: Secondary | ICD-10-CM | POA: Diagnosis not present

## 2022-05-06 DIAGNOSIS — R5383 Other fatigue: Secondary | ICD-10-CM | POA: Diagnosis not present

## 2022-05-21 DIAGNOSIS — G894 Chronic pain syndrome: Secondary | ICD-10-CM | POA: Diagnosis not present

## 2022-05-21 DIAGNOSIS — M25512 Pain in left shoulder: Secondary | ICD-10-CM | POA: Diagnosis not present

## 2022-05-21 DIAGNOSIS — Z79891 Long term (current) use of opiate analgesic: Secondary | ICD-10-CM | POA: Diagnosis not present

## 2022-05-21 DIAGNOSIS — M542 Cervicalgia: Secondary | ICD-10-CM | POA: Diagnosis not present

## 2022-05-21 DIAGNOSIS — M25511 Pain in right shoulder: Secondary | ICD-10-CM | POA: Diagnosis not present

## 2022-05-21 DIAGNOSIS — J3081 Allergic rhinitis due to animal (cat) (dog) hair and dander: Secondary | ICD-10-CM | POA: Diagnosis not present

## 2022-05-21 DIAGNOSIS — M25562 Pain in left knee: Secondary | ICD-10-CM | POA: Diagnosis not present

## 2022-05-21 DIAGNOSIS — J301 Allergic rhinitis due to pollen: Secondary | ICD-10-CM | POA: Diagnosis not present

## 2022-05-21 DIAGNOSIS — M5137 Other intervertebral disc degeneration, lumbosacral region: Secondary | ICD-10-CM | POA: Diagnosis not present

## 2022-05-21 DIAGNOSIS — J3089 Other allergic rhinitis: Secondary | ICD-10-CM | POA: Diagnosis not present

## 2022-05-21 DIAGNOSIS — M545 Low back pain, unspecified: Secondary | ICD-10-CM | POA: Diagnosis not present

## 2022-05-29 DIAGNOSIS — N3090 Cystitis, unspecified without hematuria: Secondary | ICD-10-CM | POA: Diagnosis not present

## 2022-06-25 DIAGNOSIS — G894 Chronic pain syndrome: Secondary | ICD-10-CM | POA: Diagnosis not present

## 2022-06-25 DIAGNOSIS — J3089 Other allergic rhinitis: Secondary | ICD-10-CM | POA: Diagnosis not present

## 2022-06-25 DIAGNOSIS — M545 Low back pain, unspecified: Secondary | ICD-10-CM | POA: Diagnosis not present

## 2022-06-25 DIAGNOSIS — M5137 Other intervertebral disc degeneration, lumbosacral region: Secondary | ICD-10-CM | POA: Diagnosis not present

## 2022-06-25 DIAGNOSIS — M25562 Pain in left knee: Secondary | ICD-10-CM | POA: Diagnosis not present

## 2022-06-25 DIAGNOSIS — J3081 Allergic rhinitis due to animal (cat) (dog) hair and dander: Secondary | ICD-10-CM | POA: Diagnosis not present

## 2022-06-25 DIAGNOSIS — M542 Cervicalgia: Secondary | ICD-10-CM | POA: Diagnosis not present

## 2022-06-25 DIAGNOSIS — M25511 Pain in right shoulder: Secondary | ICD-10-CM | POA: Diagnosis not present

## 2022-06-25 DIAGNOSIS — Z79891 Long term (current) use of opiate analgesic: Secondary | ICD-10-CM | POA: Diagnosis not present

## 2022-06-25 DIAGNOSIS — J301 Allergic rhinitis due to pollen: Secondary | ICD-10-CM | POA: Diagnosis not present

## 2022-06-25 DIAGNOSIS — M25512 Pain in left shoulder: Secondary | ICD-10-CM | POA: Diagnosis not present

## 2022-07-23 DIAGNOSIS — M5137 Other intervertebral disc degeneration, lumbosacral region: Secondary | ICD-10-CM | POA: Diagnosis not present

## 2022-07-23 DIAGNOSIS — J3081 Allergic rhinitis due to animal (cat) (dog) hair and dander: Secondary | ICD-10-CM | POA: Diagnosis not present

## 2022-07-23 DIAGNOSIS — M542 Cervicalgia: Secondary | ICD-10-CM | POA: Diagnosis not present

## 2022-07-23 DIAGNOSIS — M545 Low back pain, unspecified: Secondary | ICD-10-CM | POA: Diagnosis not present

## 2022-07-23 DIAGNOSIS — G894 Chronic pain syndrome: Secondary | ICD-10-CM | POA: Diagnosis not present

## 2022-07-23 DIAGNOSIS — M25562 Pain in left knee: Secondary | ICD-10-CM | POA: Diagnosis not present

## 2022-07-23 DIAGNOSIS — J301 Allergic rhinitis due to pollen: Secondary | ICD-10-CM | POA: Diagnosis not present

## 2022-07-23 DIAGNOSIS — M25511 Pain in right shoulder: Secondary | ICD-10-CM | POA: Diagnosis not present

## 2022-07-23 DIAGNOSIS — M25512 Pain in left shoulder: Secondary | ICD-10-CM | POA: Diagnosis not present

## 2022-07-23 DIAGNOSIS — Z79891 Long term (current) use of opiate analgesic: Secondary | ICD-10-CM | POA: Diagnosis not present

## 2022-08-20 DIAGNOSIS — Z79891 Long term (current) use of opiate analgesic: Secondary | ICD-10-CM | POA: Diagnosis not present

## 2022-08-20 DIAGNOSIS — M25562 Pain in left knee: Secondary | ICD-10-CM | POA: Diagnosis not present

## 2022-08-20 DIAGNOSIS — M545 Low back pain, unspecified: Secondary | ICD-10-CM | POA: Diagnosis not present

## 2022-08-20 DIAGNOSIS — J3081 Allergic rhinitis due to animal (cat) (dog) hair and dander: Secondary | ICD-10-CM | POA: Diagnosis not present

## 2022-08-20 DIAGNOSIS — J301 Allergic rhinitis due to pollen: Secondary | ICD-10-CM | POA: Diagnosis not present

## 2022-08-20 DIAGNOSIS — G894 Chronic pain syndrome: Secondary | ICD-10-CM | POA: Diagnosis not present

## 2022-08-20 DIAGNOSIS — M25512 Pain in left shoulder: Secondary | ICD-10-CM | POA: Diagnosis not present

## 2022-08-20 DIAGNOSIS — M5137 Other intervertebral disc degeneration, lumbosacral region: Secondary | ICD-10-CM | POA: Diagnosis not present

## 2022-08-20 DIAGNOSIS — M25511 Pain in right shoulder: Secondary | ICD-10-CM | POA: Diagnosis not present

## 2022-08-20 DIAGNOSIS — M542 Cervicalgia: Secondary | ICD-10-CM | POA: Diagnosis not present

## 2022-08-20 DIAGNOSIS — J3089 Other allergic rhinitis: Secondary | ICD-10-CM | POA: Diagnosis not present

## 2022-09-18 DIAGNOSIS — M542 Cervicalgia: Secondary | ICD-10-CM | POA: Diagnosis not present

## 2022-09-18 DIAGNOSIS — J301 Allergic rhinitis due to pollen: Secondary | ICD-10-CM | POA: Diagnosis not present

## 2022-09-18 DIAGNOSIS — G894 Chronic pain syndrome: Secondary | ICD-10-CM | POA: Diagnosis not present

## 2022-09-18 DIAGNOSIS — Z79891 Long term (current) use of opiate analgesic: Secondary | ICD-10-CM | POA: Diagnosis not present

## 2022-09-18 DIAGNOSIS — M25562 Pain in left knee: Secondary | ICD-10-CM | POA: Diagnosis not present

## 2022-09-18 DIAGNOSIS — M545 Low back pain, unspecified: Secondary | ICD-10-CM | POA: Diagnosis not present

## 2022-09-18 DIAGNOSIS — M25512 Pain in left shoulder: Secondary | ICD-10-CM | POA: Diagnosis not present

## 2022-09-18 DIAGNOSIS — J3081 Allergic rhinitis due to animal (cat) (dog) hair and dander: Secondary | ICD-10-CM | POA: Diagnosis not present

## 2022-09-18 DIAGNOSIS — M5137 Other intervertebral disc degeneration, lumbosacral region: Secondary | ICD-10-CM | POA: Diagnosis not present

## 2022-09-18 DIAGNOSIS — M25511 Pain in right shoulder: Secondary | ICD-10-CM | POA: Diagnosis not present

## 2022-10-04 DIAGNOSIS — J324 Chronic pansinusitis: Secondary | ICD-10-CM | POA: Diagnosis not present

## 2022-10-04 DIAGNOSIS — R0981 Nasal congestion: Secondary | ICD-10-CM | POA: Diagnosis not present

## 2022-10-04 DIAGNOSIS — R059 Cough, unspecified: Secondary | ICD-10-CM | POA: Diagnosis not present

## 2022-10-16 DIAGNOSIS — J3089 Other allergic rhinitis: Secondary | ICD-10-CM | POA: Diagnosis not present

## 2022-10-16 DIAGNOSIS — M542 Cervicalgia: Secondary | ICD-10-CM | POA: Diagnosis not present

## 2022-10-16 DIAGNOSIS — J3081 Allergic rhinitis due to animal (cat) (dog) hair and dander: Secondary | ICD-10-CM | POA: Diagnosis not present

## 2022-10-16 DIAGNOSIS — M545 Low back pain, unspecified: Secondary | ICD-10-CM | POA: Diagnosis not present

## 2022-10-16 DIAGNOSIS — M5137 Other intervertebral disc degeneration, lumbosacral region: Secondary | ICD-10-CM | POA: Diagnosis not present

## 2022-10-16 DIAGNOSIS — Z79891 Long term (current) use of opiate analgesic: Secondary | ICD-10-CM | POA: Diagnosis not present

## 2022-10-16 DIAGNOSIS — J301 Allergic rhinitis due to pollen: Secondary | ICD-10-CM | POA: Diagnosis not present

## 2022-10-16 DIAGNOSIS — M25512 Pain in left shoulder: Secondary | ICD-10-CM | POA: Diagnosis not present

## 2022-10-16 DIAGNOSIS — G894 Chronic pain syndrome: Secondary | ICD-10-CM | POA: Diagnosis not present

## 2022-10-16 DIAGNOSIS — M25562 Pain in left knee: Secondary | ICD-10-CM | POA: Diagnosis not present

## 2022-10-16 DIAGNOSIS — M25511 Pain in right shoulder: Secondary | ICD-10-CM | POA: Diagnosis not present

## 2022-11-20 DIAGNOSIS — J3089 Other allergic rhinitis: Secondary | ICD-10-CM | POA: Diagnosis not present

## 2022-11-20 DIAGNOSIS — M545 Low back pain, unspecified: Secondary | ICD-10-CM | POA: Diagnosis not present

## 2022-11-20 DIAGNOSIS — Z79891 Long term (current) use of opiate analgesic: Secondary | ICD-10-CM | POA: Diagnosis not present

## 2022-11-20 DIAGNOSIS — M25562 Pain in left knee: Secondary | ICD-10-CM | POA: Diagnosis not present

## 2022-11-20 DIAGNOSIS — J301 Allergic rhinitis due to pollen: Secondary | ICD-10-CM | POA: Diagnosis not present

## 2022-11-20 DIAGNOSIS — M25511 Pain in right shoulder: Secondary | ICD-10-CM | POA: Diagnosis not present

## 2022-11-20 DIAGNOSIS — M5137 Other intervertebral disc degeneration, lumbosacral region: Secondary | ICD-10-CM | POA: Diagnosis not present

## 2022-11-20 DIAGNOSIS — G894 Chronic pain syndrome: Secondary | ICD-10-CM | POA: Diagnosis not present

## 2022-11-20 DIAGNOSIS — M25512 Pain in left shoulder: Secondary | ICD-10-CM | POA: Diagnosis not present

## 2022-11-20 DIAGNOSIS — J3081 Allergic rhinitis due to animal (cat) (dog) hair and dander: Secondary | ICD-10-CM | POA: Diagnosis not present

## 2022-11-20 DIAGNOSIS — M542 Cervicalgia: Secondary | ICD-10-CM | POA: Diagnosis not present

## 2022-12-08 DIAGNOSIS — K219 Gastro-esophageal reflux disease without esophagitis: Secondary | ICD-10-CM | POA: Diagnosis not present

## 2022-12-08 DIAGNOSIS — I10 Essential (primary) hypertension: Secondary | ICD-10-CM | POA: Diagnosis not present

## 2022-12-08 DIAGNOSIS — R7303 Prediabetes: Secondary | ICD-10-CM | POA: Diagnosis not present

## 2022-12-08 DIAGNOSIS — E785 Hyperlipidemia, unspecified: Secondary | ICD-10-CM | POA: Diagnosis not present

## 2022-12-08 DIAGNOSIS — E039 Hypothyroidism, unspecified: Secondary | ICD-10-CM | POA: Diagnosis not present

## 2022-12-15 DIAGNOSIS — R5383 Other fatigue: Secondary | ICD-10-CM | POA: Diagnosis not present

## 2022-12-15 DIAGNOSIS — E559 Vitamin D deficiency, unspecified: Secondary | ICD-10-CM | POA: Diagnosis not present

## 2023-01-01 DIAGNOSIS — M545 Low back pain, unspecified: Secondary | ICD-10-CM | POA: Diagnosis not present

## 2023-01-01 DIAGNOSIS — M25562 Pain in left knee: Secondary | ICD-10-CM | POA: Diagnosis not present

## 2023-01-01 DIAGNOSIS — M25512 Pain in left shoulder: Secondary | ICD-10-CM | POA: Diagnosis not present

## 2023-01-01 DIAGNOSIS — M542 Cervicalgia: Secondary | ICD-10-CM | POA: Diagnosis not present

## 2023-01-01 DIAGNOSIS — Z79891 Long term (current) use of opiate analgesic: Secondary | ICD-10-CM | POA: Diagnosis not present

## 2023-01-01 DIAGNOSIS — M25511 Pain in right shoulder: Secondary | ICD-10-CM | POA: Diagnosis not present

## 2023-01-01 DIAGNOSIS — J301 Allergic rhinitis due to pollen: Secondary | ICD-10-CM | POA: Diagnosis not present

## 2023-01-01 DIAGNOSIS — J3089 Other allergic rhinitis: Secondary | ICD-10-CM | POA: Diagnosis not present

## 2023-01-01 DIAGNOSIS — G894 Chronic pain syndrome: Secondary | ICD-10-CM | POA: Diagnosis not present

## 2023-01-01 DIAGNOSIS — M5137 Other intervertebral disc degeneration, lumbosacral region: Secondary | ICD-10-CM | POA: Diagnosis not present

## 2023-01-01 DIAGNOSIS — J3081 Allergic rhinitis due to animal (cat) (dog) hair and dander: Secondary | ICD-10-CM | POA: Diagnosis not present

## 2023-01-18 DIAGNOSIS — M25552 Pain in left hip: Secondary | ICD-10-CM | POA: Diagnosis not present

## 2023-01-18 DIAGNOSIS — M549 Dorsalgia, unspecified: Secondary | ICD-10-CM | POA: Diagnosis not present

## 2023-01-18 DIAGNOSIS — M62830 Muscle spasm of back: Secondary | ICD-10-CM | POA: Diagnosis not present

## 2023-02-12 DIAGNOSIS — M25529 Pain in unspecified elbow: Secondary | ICD-10-CM | POA: Diagnosis not present

## 2023-02-12 DIAGNOSIS — I10 Essential (primary) hypertension: Secondary | ICD-10-CM | POA: Diagnosis not present

## 2023-02-15 DIAGNOSIS — M48061 Spinal stenosis, lumbar region without neurogenic claudication: Secondary | ICD-10-CM | POA: Diagnosis not present

## 2023-02-15 DIAGNOSIS — M5416 Radiculopathy, lumbar region: Secondary | ICD-10-CM | POA: Diagnosis not present

## 2023-02-16 DIAGNOSIS — M25512 Pain in left shoulder: Secondary | ICD-10-CM | POA: Diagnosis not present

## 2023-02-16 DIAGNOSIS — Z79891 Long term (current) use of opiate analgesic: Secondary | ICD-10-CM | POA: Diagnosis not present

## 2023-02-16 DIAGNOSIS — M25562 Pain in left knee: Secondary | ICD-10-CM | POA: Diagnosis not present

## 2023-02-16 DIAGNOSIS — M545 Low back pain, unspecified: Secondary | ICD-10-CM | POA: Diagnosis not present

## 2023-02-16 DIAGNOSIS — J3081 Allergic rhinitis due to animal (cat) (dog) hair and dander: Secondary | ICD-10-CM | POA: Diagnosis not present

## 2023-02-16 DIAGNOSIS — M542 Cervicalgia: Secondary | ICD-10-CM | POA: Diagnosis not present

## 2023-02-16 DIAGNOSIS — G894 Chronic pain syndrome: Secondary | ICD-10-CM | POA: Diagnosis not present

## 2023-02-16 DIAGNOSIS — J3089 Other allergic rhinitis: Secondary | ICD-10-CM | POA: Diagnosis not present

## 2023-02-16 DIAGNOSIS — J301 Allergic rhinitis due to pollen: Secondary | ICD-10-CM | POA: Diagnosis not present

## 2023-02-16 DIAGNOSIS — M25511 Pain in right shoulder: Secondary | ICD-10-CM | POA: Diagnosis not present

## 2023-02-16 DIAGNOSIS — M5137 Other intervertebral disc degeneration, lumbosacral region: Secondary | ICD-10-CM | POA: Diagnosis not present

## 2023-02-19 DIAGNOSIS — N3001 Acute cystitis with hematuria: Secondary | ICD-10-CM | POA: Diagnosis not present

## 2023-02-24 ENCOUNTER — Encounter: Payer: Self-pay | Admitting: General Practice

## 2023-03-16 DIAGNOSIS — J3089 Other allergic rhinitis: Secondary | ICD-10-CM | POA: Diagnosis not present

## 2023-03-16 DIAGNOSIS — M25562 Pain in left knee: Secondary | ICD-10-CM | POA: Diagnosis not present

## 2023-03-16 DIAGNOSIS — Z79891 Long term (current) use of opiate analgesic: Secondary | ICD-10-CM | POA: Diagnosis not present

## 2023-03-16 DIAGNOSIS — M25512 Pain in left shoulder: Secondary | ICD-10-CM | POA: Diagnosis not present

## 2023-03-16 DIAGNOSIS — M5137 Other intervertebral disc degeneration, lumbosacral region: Secondary | ICD-10-CM | POA: Diagnosis not present

## 2023-03-16 DIAGNOSIS — G894 Chronic pain syndrome: Secondary | ICD-10-CM | POA: Diagnosis not present

## 2023-03-16 DIAGNOSIS — M545 Low back pain, unspecified: Secondary | ICD-10-CM | POA: Diagnosis not present

## 2023-03-16 DIAGNOSIS — J301 Allergic rhinitis due to pollen: Secondary | ICD-10-CM | POA: Diagnosis not present

## 2023-03-16 DIAGNOSIS — J3081 Allergic rhinitis due to animal (cat) (dog) hair and dander: Secondary | ICD-10-CM | POA: Diagnosis not present

## 2023-03-16 DIAGNOSIS — M25511 Pain in right shoulder: Secondary | ICD-10-CM | POA: Diagnosis not present

## 2023-03-16 DIAGNOSIS — M542 Cervicalgia: Secondary | ICD-10-CM | POA: Diagnosis not present

## 2023-03-31 DIAGNOSIS — M5416 Radiculopathy, lumbar region: Secondary | ICD-10-CM | POA: Diagnosis not present

## 2023-03-31 DIAGNOSIS — M48061 Spinal stenosis, lumbar region without neurogenic claudication: Secondary | ICD-10-CM | POA: Diagnosis not present

## 2023-04-05 ENCOUNTER — Other Ambulatory Visit: Payer: Self-pay

## 2023-04-05 DIAGNOSIS — E785 Hyperlipidemia, unspecified: Secondary | ICD-10-CM | POA: Insufficient documentation

## 2023-04-05 DIAGNOSIS — M25569 Pain in unspecified knee: Secondary | ICD-10-CM | POA: Insufficient documentation

## 2023-04-05 DIAGNOSIS — E079 Disorder of thyroid, unspecified: Secondary | ICD-10-CM | POA: Insufficient documentation

## 2023-04-05 DIAGNOSIS — T7840XA Allergy, unspecified, initial encounter: Secondary | ICD-10-CM | POA: Insufficient documentation

## 2023-04-05 DIAGNOSIS — R600 Localized edema: Secondary | ICD-10-CM | POA: Insufficient documentation

## 2023-04-05 DIAGNOSIS — S46009A Unspecified injury of muscle(s) and tendon(s) of the rotator cuff of unspecified shoulder, initial encounter: Secondary | ICD-10-CM | POA: Insufficient documentation

## 2023-04-05 DIAGNOSIS — I10 Essential (primary) hypertension: Secondary | ICD-10-CM | POA: Insufficient documentation

## 2023-04-05 DIAGNOSIS — F419 Anxiety disorder, unspecified: Secondary | ICD-10-CM | POA: Insufficient documentation

## 2023-04-05 DIAGNOSIS — Z6834 Body mass index (BMI) 34.0-34.9, adult: Secondary | ICD-10-CM | POA: Insufficient documentation

## 2023-04-05 DIAGNOSIS — R7303 Prediabetes: Secondary | ICD-10-CM | POA: Insufficient documentation

## 2023-04-05 DIAGNOSIS — E669 Obesity, unspecified: Secondary | ICD-10-CM | POA: Insufficient documentation

## 2023-04-05 DIAGNOSIS — M25559 Pain in unspecified hip: Secondary | ICD-10-CM | POA: Insufficient documentation

## 2023-04-05 DIAGNOSIS — E559 Vitamin D deficiency, unspecified: Secondary | ICD-10-CM | POA: Insufficient documentation

## 2023-04-05 DIAGNOSIS — K219 Gastro-esophageal reflux disease without esophagitis: Secondary | ICD-10-CM | POA: Insufficient documentation

## 2023-04-05 DIAGNOSIS — R5383 Other fatigue: Secondary | ICD-10-CM | POA: Insufficient documentation

## 2023-04-05 DIAGNOSIS — F32A Depression, unspecified: Secondary | ICD-10-CM | POA: Insufficient documentation

## 2023-04-05 DIAGNOSIS — M549 Dorsalgia, unspecified: Secondary | ICD-10-CM | POA: Insufficient documentation

## 2023-04-05 DIAGNOSIS — E039 Hypothyroidism, unspecified: Secondary | ICD-10-CM | POA: Insufficient documentation

## 2023-04-05 DIAGNOSIS — N159 Renal tubulo-interstitial disease, unspecified: Secondary | ICD-10-CM | POA: Insufficient documentation

## 2023-04-05 DIAGNOSIS — M1990 Unspecified osteoarthritis, unspecified site: Secondary | ICD-10-CM | POA: Insufficient documentation

## 2023-04-05 DIAGNOSIS — M419 Scoliosis, unspecified: Secondary | ICD-10-CM | POA: Insufficient documentation

## 2023-04-05 DIAGNOSIS — K449 Diaphragmatic hernia without obstruction or gangrene: Secondary | ICD-10-CM | POA: Insufficient documentation

## 2023-04-05 DIAGNOSIS — Z720 Tobacco use: Secondary | ICD-10-CM | POA: Insufficient documentation

## 2023-04-09 DIAGNOSIS — M47816 Spondylosis without myelopathy or radiculopathy, lumbar region: Secondary | ICD-10-CM | POA: Diagnosis not present

## 2023-04-09 DIAGNOSIS — M5136 Other intervertebral disc degeneration, lumbar region: Secondary | ICD-10-CM | POA: Diagnosis not present

## 2023-04-09 DIAGNOSIS — M4726 Other spondylosis with radiculopathy, lumbar region: Secondary | ICD-10-CM | POA: Diagnosis not present

## 2023-04-14 ENCOUNTER — Ambulatory Visit: Payer: 59 | Attending: Cardiology | Admitting: Cardiology

## 2023-04-14 ENCOUNTER — Encounter: Payer: Self-pay | Admitting: Cardiology

## 2023-04-14 VITALS — BP 146/102 | HR 89 | Ht 66.0 in | Wt 210.8 lb

## 2023-04-14 DIAGNOSIS — E782 Mixed hyperlipidemia: Secondary | ICD-10-CM | POA: Diagnosis not present

## 2023-04-14 DIAGNOSIS — Z6834 Body mass index (BMI) 34.0-34.9, adult: Secondary | ICD-10-CM

## 2023-04-14 DIAGNOSIS — R079 Chest pain, unspecified: Secondary | ICD-10-CM

## 2023-04-14 DIAGNOSIS — I10 Essential (primary) hypertension: Secondary | ICD-10-CM

## 2023-04-14 HISTORY — DX: Chest pain, unspecified: R07.9

## 2023-04-14 NOTE — Progress Notes (Signed)
Cardiology Office Note:    Date:  04/14/2023   ID:  Theresa Butler, DOB Sep 02, 1956, MRN [id number]  PCP:  Paulina Fusi, MD  Cardiologist:  Garwin Brothers, MD   Referring MD: Mikki Santee Key, *    ASSESSMENT:    1. Primary hypertension   2. BMI 34.0-34.9,adult   3. Chest pain of uncertain etiology   4. Mixed hyperlipidemia    PLAN:    In order of problems listed above:  Primary prevention stressed with the patient.  Importance of compliance with diet medication stressed and patient verbalized standing. Chest pain: Atypical in nature but in view of risk factors we will do a Lexiscan sestamibi.  She is agreeable. Essential hypertension: Blood pressure stable and diet was emphasized.  She appears to have whitecoat hypertension.  She was advised to keep a track of her blood pressures at home and give it to [place] next time when she is here. Cardiac murmur: Echocardiogram will be done to assess murmur heard on auscultation. Obesity: Weight reduction was stressed and diet was emphasized and she promises to do better.  Risks of obesity explained. Hyperlipidemia: I discussed numbers from KPN sheet and diet was emphasized.  Lifestyle modification urged and she promises to do better. Patient will be seen in follow-up appointment in 6 months or earlier if the patient has any concerns.    Medication Adjustments/Labs and Tests Ordered: Current medicines are reviewed at length with the patient today.  Concerns regarding medicines are outlined above.  No orders of the defined types were placed in this encounter.  No orders of the defined types were placed in this encounter.    History of Present Illness:    Theresa Butler is a 67 y.o. female who is being seen today for the evaluation of chest pain at the request of Mikki Santee Key, *.  Patient is a pleasant 67 year old female.  She has past medical history of essential hypertension and leads a sedentary lifestyle  because of orthopedic issues.  She has been experiencing chest discomfort.  This is not related to exertion.  No orthopnea or PND.  She mentions to me that she is under significant stress because of domestic issues.  At the time of my evaluation, the patient is alert awake oriented and in no distress.  Past Medical History:  Diagnosis Date   Abnormal weight gain 08/03/2017   Acquired hypothyroidism 08/03/2017   Allergy    Anxiety    Arthritis    Back pain    herniated discs in bag   Bilateral leg edema    BMI 34.0-34.9,adult    Depression    Fatigue    GERD (gastroesophageal reflux disease)    Hiatal hernia    Hip pain    right hip   Hyperlipidemia    Hypertension    Hypothyroid    Kidney infection    Knee pain    Obesity    Pre-diabetes    Rotator cuff injury    Scoliosis    Thyroid disease    Tobacco abuse    Vitamin D deficiency     Past Surgical History:  Procedure Laterality Date   COLONOSCOPY     DILATION AND CURETTAGE OF UTERUS     2000   ROTATOR CUFF REPAIR Left    2005    Current Medications: Current Meds  Medication Sig   buprenorphine-naloxone (SUBOXONE) 8-2 mg SUBL SL tablet Place 1 tablet under the tongue daily.  buPROPion (WELLBUTRIN XL) 300 MG 24 hr tablet Take 300 mg by mouth daily.   famotidine (PEPCID) 20 MG tablet Take 1 tablet by mouth every 12 (twelve) hours as needed for heartburn or indigestion.   FLUoxetine (PROZAC) 20 MG tablet Take 60 mg by mouth daily.   fluticasone (FLONASE) 50 MCG/ACT nasal spray Place 2 sprays into both nostrils 2 (two) times daily as needed for allergies or rhinitis.   levothyroxine (SYNTHROID, LEVOTHROID) 75 MCG tablet Take 75 mcg by mouth daily before breakfast.   lisinopril (PRINIVIL,ZESTRIL) 10 MG tablet Take 5 mg by mouth daily.   Vitamin D, Ergocalciferol, (DRISDOL) 1.25 MG (50000 UNIT) CAPS capsule Take 50,000 Units by mouth once a week.     Allergies:   Penicillins, Sulfa antibiotics, and  Sulfamethoxazole-trimethoprim   Social History   Socioeconomic History   Marital status: Married    Spouse name: Not on file   Number of children: Not on file   Years of education: Not on file   Highest education level: Not on file  Occupational History   Not on file  Tobacco Use   Smoking status: Former   Smokeless tobacco: Never  Vaping Use   Vaping Use: Some days  Substance and Sexual Activity   Alcohol use: Never   Drug use: Never   Sexual activity: Not on file  Other Topics Concern   Not on file  Social History Narrative   Not on file   Social Determinants of Health   Financial Resource Strain: Not on file  Food Insecurity: Not on file  Transportation Needs: Not on file  Physical Activity: Not on file  Stress: Not on file  Social Connections: Not on file     Family History: The patient's family history includes Arthritis in her father, maternal grandfather, maternal grandmother, mother, paternal grandfather, and paternal grandmother; Cancer in her father; Diabetes in her father and maternal grandfather; Esophageal cancer in her father; Heart disease in her father and maternal grandfather; Hypertension in her father; Stroke in her father; Thyroid disease in her maternal grandfather and maternal grandmother. There is no history of Colon cancer, Stomach cancer, or Rectal cancer.  ROS:   Please see the history of present illness.    All other systems reviewed and are negative.  EKGs/Labs/Other Studies Reviewed:    The following studies were reviewed today: EKG reveals sinus rhythm and nonspecific ST-T changes   Recent Labs: No results found for requested labs within last 365 days.  Recent Lipid Panel No results found for: "CHOL", "TRIG", "HDL", "CHOLHDL", "VLDL", "LDLCALC", "LDLDIRECT"  Physical Exam:    VS:  BP (!) 146/102   Pulse 89   Ht 5\' 6"  (1.676 m)   Wt 210 lb 12.8 oz (95.6 kg)   SpO2 95%   BMI 34.02 kg/m     Wt Readings from Last 3 Encounters:   04/14/23 210 lb 12.8 oz (95.6 kg)  01/23/20 194 lb (88 kg)  01/09/20 194 lb (88 kg)     GEN: Patient is in no acute distress HEENT: Normal NECK: No JVD; No carotid bruits LYMPHATICS: No lymphadenopathy CARDIAC: S1 S2 regular, 2/6 systolic murmur at the apex. RESPIRATORY:  Clear to auscultation without rales, wheezing or rhonchi  ABDOMEN: Soft, non-tender, non-distended MUSCULOSKELETAL:  No edema; No deformity  SKIN: Warm and dry NEUROLOGIC:  Alert and oriented x 3 PSYCHIATRIC:  Normal affect    Signed, Garwin Brothers, MD  04/14/2023 3:16 PM    [facility]

## 2023-04-14 NOTE — Patient Instructions (Signed)
Medication Instructions:  Your physician recommends that you continue on your current medications as directed. Please refer to the Current Medication list given to you today.  *If you need a refill on your cardiac medications before your next appointment, please call your pharmacy*   Lab Work: None ordered If you have labs (blood work) drawn today and your tests are completely normal, you will receive your results only by: MyChart Message (if you have MyChart) OR A paper copy in the mail If you have any lab test that is abnormal or we need to change your treatment, we will call you to review the results.   Testing/Procedures: You are scheduled for a Myocardial Perfusion Imaging Study.  Please arrive 15 minutes prior to your appointment time for registration and insurance purposes.  The test will take approximately 3 to 4 hours to complete; you may bring reading material.  If someone comes with you to your appointment, they will need to remain in the main lobby due to limited space in the testing area.   How to prepare for your Myocardial Perfusion Test: Do not eat or drink 3 hours prior to your test, except you may have water. Do not consume products containing caffeine (regular or decaffeinated) 12 hours prior to your test. (ex: coffee, chocolate, sodas, tea). Do bring a list of your current medications with you.  If not listed below, you may take your medications as normal. Do wear comfortable clothes (no dresses or overalls) and walking shoes, tennis shoes preferred (No heels or open toe shoes are allowed). Do NOT wear cologne, perfume, aftershave, or lotions (deodorant is allowed). If these instructions are not followed, your test will have to be rescheduled.  If you cannot keep your appointment, please provide 24 hours notification to the Nuclear [facility], to avoid a possible $50 charge to your account.   Your physician has requested that you have an echocardiogram. Echocardiography is  a painless test that uses sound waves to create images of your heart. It provides your doctor with information about the size and shape of your heart and how well your heart's chambers and valves are working. This procedure takes approximately one hour. There are no restrictions for this procedure. Please do NOT wear cologne, perfume, aftershave, or lotions (deodorant is allowed). Please arrive 15 minutes prior to your appointment time.   Follow-Up: At [facility], you and your health needs are our priority.  As part of our continuing mission to provide you with exceptional heart care, we have created designated Provider Care Teams.  These Care Teams include your primary Cardiologist (physician) and Advanced Practice Providers (APPs -  Physician Assistants and Nurse Practitioners) who all work together to provide you with the care you need, when you need it.  We recommend signing up for the patient portal called "MyChart".  Sign up information is provided on this After Visit Summary.  MyChart is used to connect with patients for Virtual Visits (Telemedicine).  Patients are able to view lab/test results, encounter notes, upcoming appointments, etc.  Non-urgent messages can be sent to your provider as well.   To learn more about what you can do with MyChart, go to [email or web address].    Your next appointment:   9 month(s)  Provider:   Rajan Revankar, MD   Other Instructions  Cardiac Nuclear Scan A cardiac nuclear scan is a test that is done to check the flow of blood to your heart. It is done when you are resting and   when you are exercising. The test looks for problems such as: Not enough blood reaching a portion of the heart. The heart muscle not working as it should. You may need this test if you have: Heart disease. Lab results that are not normal. Had heart surgery or a balloon procedure to open up blocked arteries (angioplasty) or a small mesh tube (stent). Chest  pain. Shortness of breath. Had a heart attack. In this test, a special dye (tracer) is put into your bloodstream. The tracer will travel to your heart. A camera will then take pictures of your heart to see how the tracer moves through your heart. This test is usually done at a hospital and takes 2-4 hours. Tell a doctor about: Any allergies you have. All medicines you are taking, including vitamins, herbs, eye drops, creams, and over-the-counter medicines. Any bleeding problems you have. Any surgeries you have had. Any medical conditions you have. Whether you are pregnant or may be pregnant. Any history of asthma or long-term (chronic) lung disease. Any history of heart rhythm disorders or heart valve conditions. What are the risks? Your doctor will talk with you about risks. These may include: Serious chest pain and heart attack. This is only a risk if the stress portion of the test is done. Fast or uneven heartbeats (palpitations). A feeling of warmth in your chest. This feeling usually does not last long. Allergic reaction to the tracer. Shortness of breath or trouble breathing. What happens before the test? Ask your doctor about changing or stopping your normal medicines. Follow instructions from your doctor about what you cannot eat or drink. Remove your jewelry on the day of the test. Ask your doctor if you need to avoid nicotine or caffeine. What happens during the test? An IV tube will be inserted into one of your veins. Your doctor will give you a small amount of tracer through the IV tube. You will wait for 20-40 minutes while the tracer moves through your bloodstream. Your heart will be monitored with an electrocardiogram (ECG). You will lie down on an exam table. Pictures of your heart will be taken for about 15-20 minutes. You may also have a stress test. For this test, one of these things may be done: You will be asked to exercise on a treadmill or a stationary  bike. You will be given medicines that will make your heart work harder. This is done if you are unable to exercise. When blood flow to your heart has peaked, a tracer will again be given through the IV tube. After 20-40 minutes, you will get back on the exam table. More pictures will be taken of your heart. Depending on the tracer that is used, more pictures may need to be taken 3-4 hours later. Your IV tube will be removed when the test is over. The test may vary among doctors and hospitals. What happens after the test? Ask your doctor: Whether you can return to your normal schedule, including diet, activities, travel, and medicines. Whether you should drink more fluids. This will help to remove the tracer from your body. Ask your doctor, or the department that is doing the test: When will my results be ready? How will I get my results? What are my treatment options? What other tests do I need? What are my next steps? This information is not intended to replace advice given to you by your health care provider. Make sure you discuss any questions you have with your health care provider.   Document Revised: 03/17/2022 Document Reviewed: 03/17/2022 Elsevier Patient Education  2023 [employer]  Echocardiogram An echocardiogram is a test that uses sound waves (ultrasound) to produce images of the heart. Images from an echocardiogram can provide important information about: Heart size and shape. The size and thickness and movement of your heart's walls. Heart muscle function and strength. Heart valve function or if you have stenosis. Stenosis is when the heart valves are too narrow. If blood is flowing backward through the heart valves (regurgitation). A tumor or infectious growth around the heart valves. Areas of heart muscle that are not working well because of poor blood flow or injury from a heart attack. Aneurysm detection. An aneurysm is a weak or damaged part of an artery wall. The  wall bulges out from the normal force of blood pumping through the body. Tell a health care provider about: Any allergies you have. All medicines you are taking, including vitamins, herbs, eye drops, creams, and over-the-counter medicines. Any blood disorders you have. Any surgeries you have had. Any medical conditions you have. Whether you are pregnant or may be pregnant. What are the risks? Generally, this is a safe test. However, problems may occur, including an allergic reaction to dye (contrast) that may be used during the test. What happens before the test? No specific preparation is needed. You may eat and drink normally. What happens during the test?  You will take off your clothes from the waist up and put on a hospital gown. Electrodes or electrocardiogram (ECG)patches may be placed on your chest. The electrodes or patches are then connected to a device that monitors your heart rate and rhythm. You will lie down on a table for an ultrasound exam. A gel will be applied to your chest to help sound waves pass through your skin. A handheld device, called a transducer, will be pressed against your chest and moved over your heart. The transducer produces sound waves that travel to your heart and bounce back (or "echo" back) to the transducer. These sound waves will be captured in real-time and changed into images of your heart that can be viewed on a video monitor. The images will be recorded on a computer and reviewed by your health care provider. You may be asked to change positions or hold your breath for a short time. This makes it easier to get different views or better views of your heart. In some cases, you may receive contrast through an IV in one of your veins. This can improve the quality of the pictures from your heart. The procedure may vary among health care providers and hospitals. What can I expect after the test? You may return to your normal, everyday life, including diet,  activities, and medicines, unless your health care provider tells you not to do that. Follow these instructions at home: It is up to you to get the results of your test. Ask your health care provider, or the department that is doing the test, when your results will be ready. Keep all follow-up visits. This is important. Summary An echocardiogram is a test that uses sound waves (ultrasound) to produce images of the heart. Images from an echocardiogram can provide important information about the size and shape of your heart, heart muscle function, heart valve function, and other possible heart problems. You do not need to do anything to prepare before this test. You may eat and drink normally. After the echocardiogram is completed, you may return to your normal, everyday life,   unless your health care provider tells you not to do that. This information is not intended to replace advice given to you by your health care provider. Make sure you discuss any questions you have with your health care provider. Document Revised: 07/02/2021 Document Reviewed: 06/11/2020 Elsevier Patient Education  2023 [employer] Inc[employer]    

## 2023-04-26 DIAGNOSIS — R1011 Right upper quadrant pain: Secondary | ICD-10-CM | POA: Diagnosis not present

## 2023-04-26 DIAGNOSIS — M2550 Pain in unspecified joint: Secondary | ICD-10-CM | POA: Diagnosis not present

## 2023-04-29 DIAGNOSIS — Z79891 Long term (current) use of opiate analgesic: Secondary | ICD-10-CM | POA: Diagnosis not present

## 2023-04-29 DIAGNOSIS — M545 Low back pain, unspecified: Secondary | ICD-10-CM | POA: Diagnosis not present

## 2023-04-29 DIAGNOSIS — G894 Chronic pain syndrome: Secondary | ICD-10-CM | POA: Diagnosis not present

## 2023-04-29 DIAGNOSIS — J3081 Allergic rhinitis due to animal (cat) (dog) hair and dander: Secondary | ICD-10-CM | POA: Diagnosis not present

## 2023-04-29 DIAGNOSIS — M5137 Other intervertebral disc degeneration, lumbosacral region: Secondary | ICD-10-CM | POA: Diagnosis not present

## 2023-04-29 DIAGNOSIS — M542 Cervicalgia: Secondary | ICD-10-CM | POA: Diagnosis not present

## 2023-04-29 DIAGNOSIS — M25512 Pain in left shoulder: Secondary | ICD-10-CM | POA: Diagnosis not present

## 2023-04-29 DIAGNOSIS — J3089 Other allergic rhinitis: Secondary | ICD-10-CM | POA: Diagnosis not present

## 2023-04-29 DIAGNOSIS — M25562 Pain in left knee: Secondary | ICD-10-CM | POA: Diagnosis not present

## 2023-04-29 DIAGNOSIS — J301 Allergic rhinitis due to pollen: Secondary | ICD-10-CM | POA: Diagnosis not present

## 2023-04-29 DIAGNOSIS — M25511 Pain in right shoulder: Secondary | ICD-10-CM | POA: Diagnosis not present

## 2023-04-29 DIAGNOSIS — Z79899 Other long term (current) drug therapy: Secondary | ICD-10-CM | POA: Diagnosis not present

## 2023-05-03 DIAGNOSIS — M5416 Radiculopathy, lumbar region: Secondary | ICD-10-CM | POA: Diagnosis not present

## 2023-05-25 DIAGNOSIS — M25562 Pain in left knee: Secondary | ICD-10-CM | POA: Diagnosis not present

## 2023-05-25 DIAGNOSIS — M25511 Pain in right shoulder: Secondary | ICD-10-CM | POA: Diagnosis not present

## 2023-05-25 DIAGNOSIS — M545 Low back pain, unspecified: Secondary | ICD-10-CM | POA: Diagnosis not present

## 2023-05-25 DIAGNOSIS — Z79899 Other long term (current) drug therapy: Secondary | ICD-10-CM | POA: Diagnosis not present

## 2023-05-25 DIAGNOSIS — M5137 Other intervertebral disc degeneration, lumbosacral region: Secondary | ICD-10-CM | POA: Diagnosis not present

## 2023-05-25 DIAGNOSIS — J3089 Other allergic rhinitis: Secondary | ICD-10-CM | POA: Diagnosis not present

## 2023-05-25 DIAGNOSIS — G894 Chronic pain syndrome: Secondary | ICD-10-CM | POA: Diagnosis not present

## 2023-05-25 DIAGNOSIS — J3081 Allergic rhinitis due to animal (cat) (dog) hair and dander: Secondary | ICD-10-CM | POA: Diagnosis not present

## 2023-05-25 DIAGNOSIS — Z79891 Long term (current) use of opiate analgesic: Secondary | ICD-10-CM | POA: Diagnosis not present

## 2023-05-25 DIAGNOSIS — M25512 Pain in left shoulder: Secondary | ICD-10-CM | POA: Diagnosis not present

## 2023-05-25 DIAGNOSIS — M542 Cervicalgia: Secondary | ICD-10-CM | POA: Diagnosis not present

## 2023-05-25 DIAGNOSIS — J301 Allergic rhinitis due to pollen: Secondary | ICD-10-CM | POA: Diagnosis not present

## 2023-05-27 ENCOUNTER — Telehealth (HOSPITAL_COMMUNITY): Payer: Self-pay | Admitting: *Deleted

## 2023-05-27 NOTE — Telephone Encounter (Signed)
Per DPR left detailed instructions for MPI study.

## 2023-06-03 ENCOUNTER — Ambulatory Visit: Payer: 59

## 2023-06-03 ENCOUNTER — Ambulatory Visit (INDEPENDENT_AMBULATORY_CARE_PROVIDER_SITE_OTHER): Payer: 59

## 2023-06-03 DIAGNOSIS — R079 Chest pain, unspecified: Secondary | ICD-10-CM | POA: Diagnosis not present

## 2023-06-03 LAB — MYOCARDIAL PERFUSION IMAGING
LV dias vol: 105 mL (ref 46–106)
LV sys vol: 38 mL
Nuc Stress EF: 64 %
Peak HR: 94 {beats}/min
Rest HR: 70 {beats}/min
Rest Nuclear Isotope Dose: 10.7 mCi
SDS: 2
SRS: 0
SSS: 2
Stress Nuclear Isotope Dose: 31.7 mCi
TID: 1.17

## 2023-06-03 LAB — ECHOCARDIOGRAM COMPLETE
Height: 66 in
S' Lateral: 3.3 cm
Weight: 3360 oz

## 2023-06-03 MED ORDER — TECHNETIUM TC 99M TETROFOSMIN IV KIT
10.7000 | PACK | Freq: Once | INTRAVENOUS | Status: AC | PRN
  Administered 2023-06-03: 10.7 via INTRAVENOUS

## 2023-06-03 MED ORDER — TECHNETIUM TC 99M TETROFOSMIN IV KIT
31.7000 | PACK | Freq: Once | INTRAVENOUS | Status: AC | PRN
  Administered 2023-06-03: 31.7 via INTRAVENOUS

## 2023-06-03 MED ORDER — REGADENOSON 0.4 MG/5ML IV SOLN
0.4000 mg | Freq: Once | INTRAVENOUS | Status: AC
  Administered 2023-06-03: 0.4 mg via INTRAVENOUS

## 2023-06-07 DIAGNOSIS — M5416 Radiculopathy, lumbar region: Secondary | ICD-10-CM | POA: Diagnosis not present

## 2023-06-22 DIAGNOSIS — M545 Low back pain, unspecified: Secondary | ICD-10-CM | POA: Diagnosis not present

## 2023-06-22 DIAGNOSIS — J3089 Other allergic rhinitis: Secondary | ICD-10-CM | POA: Diagnosis not present

## 2023-06-22 DIAGNOSIS — M542 Cervicalgia: Secondary | ICD-10-CM | POA: Diagnosis not present

## 2023-06-22 DIAGNOSIS — M25511 Pain in right shoulder: Secondary | ICD-10-CM | POA: Diagnosis not present

## 2023-06-22 DIAGNOSIS — M25512 Pain in left shoulder: Secondary | ICD-10-CM | POA: Diagnosis not present

## 2023-06-22 DIAGNOSIS — J3081 Allergic rhinitis due to animal (cat) (dog) hair and dander: Secondary | ICD-10-CM | POA: Diagnosis not present

## 2023-06-22 DIAGNOSIS — J301 Allergic rhinitis due to pollen: Secondary | ICD-10-CM | POA: Diagnosis not present

## 2023-06-22 DIAGNOSIS — M5137 Other intervertebral disc degeneration, lumbosacral region: Secondary | ICD-10-CM | POA: Diagnosis not present

## 2023-06-22 DIAGNOSIS — M25562 Pain in left knee: Secondary | ICD-10-CM | POA: Diagnosis not present

## 2023-06-22 DIAGNOSIS — Z79891 Long term (current) use of opiate analgesic: Secondary | ICD-10-CM | POA: Diagnosis not present

## 2023-06-22 DIAGNOSIS — R5383 Other fatigue: Secondary | ICD-10-CM | POA: Diagnosis not present

## 2023-06-22 DIAGNOSIS — G894 Chronic pain syndrome: Secondary | ICD-10-CM | POA: Diagnosis not present

## 2023-06-22 DIAGNOSIS — E559 Vitamin D deficiency, unspecified: Secondary | ICD-10-CM | POA: Diagnosis not present

## 2023-07-06 DIAGNOSIS — N3000 Acute cystitis without hematuria: Secondary | ICD-10-CM | POA: Diagnosis not present

## 2023-07-06 DIAGNOSIS — N3091 Cystitis, unspecified with hematuria: Secondary | ICD-10-CM | POA: Diagnosis not present

## 2023-07-20 DIAGNOSIS — M25511 Pain in right shoulder: Secondary | ICD-10-CM | POA: Diagnosis not present

## 2023-07-20 DIAGNOSIS — E782 Mixed hyperlipidemia: Secondary | ICD-10-CM | POA: Diagnosis not present

## 2023-07-20 DIAGNOSIS — J3081 Allergic rhinitis due to animal (cat) (dog) hair and dander: Secondary | ICD-10-CM | POA: Diagnosis not present

## 2023-07-20 DIAGNOSIS — M545 Low back pain, unspecified: Secondary | ICD-10-CM | POA: Diagnosis not present

## 2023-07-20 DIAGNOSIS — E559 Vitamin D deficiency, unspecified: Secondary | ICD-10-CM | POA: Diagnosis not present

## 2023-07-20 DIAGNOSIS — Z139 Encounter for screening, unspecified: Secondary | ICD-10-CM | POA: Diagnosis not present

## 2023-07-20 DIAGNOSIS — M542 Cervicalgia: Secondary | ICD-10-CM | POA: Diagnosis not present

## 2023-07-20 DIAGNOSIS — J3089 Other allergic rhinitis: Secondary | ICD-10-CM | POA: Diagnosis not present

## 2023-07-20 DIAGNOSIS — J301 Allergic rhinitis due to pollen: Secondary | ICD-10-CM | POA: Diagnosis not present

## 2023-07-20 DIAGNOSIS — M25512 Pain in left shoulder: Secondary | ICD-10-CM | POA: Diagnosis not present

## 2023-07-20 DIAGNOSIS — R7303 Prediabetes: Secondary | ICD-10-CM | POA: Diagnosis not present

## 2023-07-20 DIAGNOSIS — K219 Gastro-esophageal reflux disease without esophagitis: Secondary | ICD-10-CM | POA: Diagnosis not present

## 2023-07-20 DIAGNOSIS — E034 Atrophy of thyroid (acquired): Secondary | ICD-10-CM | POA: Diagnosis not present

## 2023-07-20 DIAGNOSIS — Z9181 History of falling: Secondary | ICD-10-CM | POA: Diagnosis not present

## 2023-07-20 DIAGNOSIS — M5137 Other intervertebral disc degeneration, lumbosacral region: Secondary | ICD-10-CM | POA: Diagnosis not present

## 2023-07-20 DIAGNOSIS — I10 Essential (primary) hypertension: Secondary | ICD-10-CM | POA: Diagnosis not present

## 2023-07-20 DIAGNOSIS — G894 Chronic pain syndrome: Secondary | ICD-10-CM | POA: Diagnosis not present

## 2023-07-20 DIAGNOSIS — M25562 Pain in left knee: Secondary | ICD-10-CM | POA: Diagnosis not present

## 2023-07-20 DIAGNOSIS — Z79891 Long term (current) use of opiate analgesic: Secondary | ICD-10-CM | POA: Diagnosis not present

## 2023-08-17 DIAGNOSIS — J3081 Allergic rhinitis due to animal (cat) (dog) hair and dander: Secondary | ICD-10-CM | POA: Diagnosis not present

## 2023-08-17 DIAGNOSIS — G894 Chronic pain syndrome: Secondary | ICD-10-CM | POA: Diagnosis not present

## 2023-08-17 DIAGNOSIS — Z79891 Long term (current) use of opiate analgesic: Secondary | ICD-10-CM | POA: Diagnosis not present

## 2023-08-17 DIAGNOSIS — M5137 Other intervertebral disc degeneration, lumbosacral region with discogenic back pain only: Secondary | ICD-10-CM | POA: Diagnosis not present

## 2023-08-17 DIAGNOSIS — M25512 Pain in left shoulder: Secondary | ICD-10-CM | POA: Diagnosis not present

## 2023-08-17 DIAGNOSIS — J301 Allergic rhinitis due to pollen: Secondary | ICD-10-CM | POA: Diagnosis not present

## 2023-08-17 DIAGNOSIS — M25562 Pain in left knee: Secondary | ICD-10-CM | POA: Diagnosis not present

## 2023-08-17 DIAGNOSIS — M542 Cervicalgia: Secondary | ICD-10-CM | POA: Diagnosis not present

## 2023-08-17 DIAGNOSIS — J3089 Other allergic rhinitis: Secondary | ICD-10-CM | POA: Diagnosis not present

## 2023-08-17 DIAGNOSIS — M545 Low back pain, unspecified: Secondary | ICD-10-CM | POA: Diagnosis not present

## 2023-08-17 DIAGNOSIS — M25511 Pain in right shoulder: Secondary | ICD-10-CM | POA: Diagnosis not present

## 2023-08-19 DIAGNOSIS — N816 Rectocele: Secondary | ICD-10-CM | POA: Diagnosis not present

## 2023-10-01 DIAGNOSIS — J309 Allergic rhinitis, unspecified: Secondary | ICD-10-CM | POA: Diagnosis not present

## 2023-10-01 DIAGNOSIS — Z79891 Long term (current) use of opiate analgesic: Secondary | ICD-10-CM | POA: Diagnosis not present

## 2023-10-01 DIAGNOSIS — M5137 Other intervertebral disc degeneration, lumbosacral region with discogenic back pain only: Secondary | ICD-10-CM | POA: Diagnosis not present

## 2023-10-01 DIAGNOSIS — J3081 Allergic rhinitis due to animal (cat) (dog) hair and dander: Secondary | ICD-10-CM | POA: Diagnosis not present

## 2023-10-01 DIAGNOSIS — J301 Allergic rhinitis due to pollen: Secondary | ICD-10-CM | POA: Diagnosis not present

## 2023-10-01 DIAGNOSIS — G894 Chronic pain syndrome: Secondary | ICD-10-CM | POA: Diagnosis not present

## 2023-10-18 DIAGNOSIS — M1712 Unilateral primary osteoarthritis, left knee: Secondary | ICD-10-CM | POA: Diagnosis not present

## 2023-10-29 DIAGNOSIS — J301 Allergic rhinitis due to pollen: Secondary | ICD-10-CM | POA: Diagnosis not present

## 2023-10-29 DIAGNOSIS — G894 Chronic pain syndrome: Secondary | ICD-10-CM | POA: Diagnosis not present

## 2023-10-29 DIAGNOSIS — J3081 Allergic rhinitis due to animal (cat) (dog) hair and dander: Secondary | ICD-10-CM | POA: Diagnosis not present

## 2023-10-29 DIAGNOSIS — Z79891 Long term (current) use of opiate analgesic: Secondary | ICD-10-CM | POA: Diagnosis not present

## 2023-10-29 DIAGNOSIS — J309 Allergic rhinitis, unspecified: Secondary | ICD-10-CM | POA: Diagnosis not present

## 2023-11-17 DIAGNOSIS — N816 Rectocele: Secondary | ICD-10-CM | POA: Diagnosis not present

## 2023-11-23 DIAGNOSIS — R300 Dysuria: Secondary | ICD-10-CM | POA: Diagnosis not present

## 2023-11-23 DIAGNOSIS — N3091 Cystitis, unspecified with hematuria: Secondary | ICD-10-CM | POA: Diagnosis not present

## 2023-12-10 DIAGNOSIS — Z79891 Long term (current) use of opiate analgesic: Secondary | ICD-10-CM | POA: Diagnosis not present

## 2023-12-10 DIAGNOSIS — R5383 Other fatigue: Secondary | ICD-10-CM | POA: Diagnosis not present

## 2023-12-10 DIAGNOSIS — G894 Chronic pain syndrome: Secondary | ICD-10-CM | POA: Diagnosis not present

## 2023-12-10 DIAGNOSIS — E559 Vitamin D deficiency, unspecified: Secondary | ICD-10-CM | POA: Diagnosis not present

## 2023-12-11 DIAGNOSIS — N3090 Cystitis, unspecified without hematuria: Secondary | ICD-10-CM | POA: Diagnosis not present

## 2023-12-11 DIAGNOSIS — R300 Dysuria: Secondary | ICD-10-CM | POA: Diagnosis not present

## 2023-12-17 DIAGNOSIS — R5383 Other fatigue: Secondary | ICD-10-CM | POA: Diagnosis not present

## 2023-12-17 DIAGNOSIS — E559 Vitamin D deficiency, unspecified: Secondary | ICD-10-CM | POA: Diagnosis not present

## 2023-12-29 DIAGNOSIS — N816 Rectocele: Secondary | ICD-10-CM | POA: Diagnosis not present

## 2023-12-29 DIAGNOSIS — F17290 Nicotine dependence, other tobacco product, uncomplicated: Secondary | ICD-10-CM | POA: Diagnosis not present

## 2023-12-29 DIAGNOSIS — E039 Hypothyroidism, unspecified: Secondary | ICD-10-CM | POA: Diagnosis not present

## 2023-12-29 DIAGNOSIS — Z87440 Personal history of urinary (tract) infections: Secondary | ICD-10-CM | POA: Diagnosis not present

## 2023-12-29 DIAGNOSIS — K219 Gastro-esophageal reflux disease without esophagitis: Secondary | ICD-10-CM | POA: Diagnosis not present

## 2023-12-29 DIAGNOSIS — R011 Cardiac murmur, unspecified: Secondary | ICD-10-CM | POA: Diagnosis not present

## 2024-01-14 DIAGNOSIS — M25562 Pain in left knee: Secondary | ICD-10-CM | POA: Diagnosis not present

## 2024-01-14 DIAGNOSIS — M25511 Pain in right shoulder: Secondary | ICD-10-CM | POA: Diagnosis not present

## 2024-01-14 DIAGNOSIS — G894 Chronic pain syndrome: Secondary | ICD-10-CM | POA: Diagnosis not present

## 2024-01-14 DIAGNOSIS — Z79891 Long term (current) use of opiate analgesic: Secondary | ICD-10-CM | POA: Diagnosis not present

## 2024-01-14 DIAGNOSIS — M542 Cervicalgia: Secondary | ICD-10-CM | POA: Diagnosis not present

## 2024-01-20 DIAGNOSIS — H3021 Posterior cyclitis, right eye: Secondary | ICD-10-CM | POA: Diagnosis not present

## 2024-01-20 DIAGNOSIS — H43822 Vitreomacular adhesion, left eye: Secondary | ICD-10-CM | POA: Diagnosis not present

## 2024-01-26 DIAGNOSIS — R0981 Nasal congestion: Secondary | ICD-10-CM | POA: Diagnosis not present

## 2024-02-01 DIAGNOSIS — H3021 Posterior cyclitis, right eye: Secondary | ICD-10-CM | POA: Diagnosis not present

## 2024-02-03 DIAGNOSIS — H3021 Posterior cyclitis, right eye: Secondary | ICD-10-CM | POA: Diagnosis not present

## 2024-02-15 DIAGNOSIS — Z09 Encounter for follow-up examination after completed treatment for conditions other than malignant neoplasm: Secondary | ICD-10-CM | POA: Diagnosis not present

## 2024-02-17 DIAGNOSIS — H53451 Other localized visual field defect, right eye: Secondary | ICD-10-CM | POA: Diagnosis not present

## 2024-02-17 DIAGNOSIS — R7303 Prediabetes: Secondary | ICD-10-CM | POA: Diagnosis not present

## 2024-02-17 DIAGNOSIS — E034 Atrophy of thyroid (acquired): Secondary | ICD-10-CM | POA: Diagnosis not present

## 2024-02-17 DIAGNOSIS — K219 Gastro-esophageal reflux disease without esophagitis: Secondary | ICD-10-CM | POA: Diagnosis not present

## 2024-02-17 DIAGNOSIS — E559 Vitamin D deficiency, unspecified: Secondary | ICD-10-CM | POA: Diagnosis not present

## 2024-02-17 DIAGNOSIS — E782 Mixed hyperlipidemia: Secondary | ICD-10-CM | POA: Diagnosis not present

## 2024-02-17 DIAGNOSIS — N3001 Acute cystitis with hematuria: Secondary | ICD-10-CM | POA: Diagnosis not present

## 2024-02-17 DIAGNOSIS — R300 Dysuria: Secondary | ICD-10-CM | POA: Diagnosis not present

## 2024-02-17 DIAGNOSIS — I10 Essential (primary) hypertension: Secondary | ICD-10-CM | POA: Diagnosis not present

## 2024-02-22 DIAGNOSIS — H53451 Other localized visual field defect, right eye: Secondary | ICD-10-CM | POA: Diagnosis not present

## 2024-02-22 DIAGNOSIS — R930 Abnormal findings on diagnostic imaging of skull and head, not elsewhere classified: Secondary | ICD-10-CM | POA: Diagnosis not present

## 2024-02-23 DIAGNOSIS — G894 Chronic pain syndrome: Secondary | ICD-10-CM | POA: Diagnosis not present

## 2024-02-23 DIAGNOSIS — Z79891 Long term (current) use of opiate analgesic: Secondary | ICD-10-CM | POA: Diagnosis not present

## 2024-02-23 DIAGNOSIS — M25512 Pain in left shoulder: Secondary | ICD-10-CM | POA: Diagnosis not present

## 2024-02-23 DIAGNOSIS — M542 Cervicalgia: Secondary | ICD-10-CM | POA: Diagnosis not present

## 2024-02-23 DIAGNOSIS — M25511 Pain in right shoulder: Secondary | ICD-10-CM | POA: Diagnosis not present

## 2024-02-23 DIAGNOSIS — R251 Tremor, unspecified: Secondary | ICD-10-CM | POA: Diagnosis not present

## 2024-02-23 DIAGNOSIS — H5711 Ocular pain, right eye: Secondary | ICD-10-CM | POA: Diagnosis not present

## 2024-02-29 DIAGNOSIS — L299 Pruritus, unspecified: Secondary | ICD-10-CM | POA: Diagnosis not present

## 2024-02-29 DIAGNOSIS — R600 Localized edema: Secondary | ICD-10-CM | POA: Diagnosis not present

## 2024-02-29 DIAGNOSIS — I83892 Varicose veins of left lower extremities with other complications: Secondary | ICD-10-CM | POA: Diagnosis not present

## 2024-02-29 DIAGNOSIS — I87393 Chronic venous hypertension (idiopathic) with other complications of bilateral lower extremity: Secondary | ICD-10-CM | POA: Diagnosis not present

## 2024-03-16 DIAGNOSIS — I872 Venous insufficiency (chronic) (peripheral): Secondary | ICD-10-CM | POA: Diagnosis not present

## 2024-03-23 DIAGNOSIS — G894 Chronic pain syndrome: Secondary | ICD-10-CM | POA: Diagnosis not present

## 2024-03-23 DIAGNOSIS — M25511 Pain in right shoulder: Secondary | ICD-10-CM | POA: Diagnosis not present

## 2024-03-23 DIAGNOSIS — J3081 Allergic rhinitis due to animal (cat) (dog) hair and dander: Secondary | ICD-10-CM | POA: Diagnosis not present

## 2024-03-23 DIAGNOSIS — G8911 Acute pain due to trauma: Secondary | ICD-10-CM | POA: Diagnosis not present

## 2024-03-23 DIAGNOSIS — J309 Allergic rhinitis, unspecified: Secondary | ICD-10-CM | POA: Diagnosis not present

## 2024-03-23 DIAGNOSIS — Z79891 Long term (current) use of opiate analgesic: Secondary | ICD-10-CM | POA: Diagnosis not present

## 2024-04-18 DIAGNOSIS — I872 Venous insufficiency (chronic) (peripheral): Secondary | ICD-10-CM | POA: Diagnosis not present

## 2024-04-20 DIAGNOSIS — G894 Chronic pain syndrome: Secondary | ICD-10-CM | POA: Diagnosis not present

## 2024-04-20 DIAGNOSIS — Z79891 Long term (current) use of opiate analgesic: Secondary | ICD-10-CM | POA: Diagnosis not present

## 2024-04-25 DIAGNOSIS — I83892 Varicose veins of left lower extremities with other complications: Secondary | ICD-10-CM | POA: Diagnosis not present

## 2024-05-16 DIAGNOSIS — I83892 Varicose veins of left lower extremities with other complications: Secondary | ICD-10-CM | POA: Diagnosis not present

## 2024-05-16 DIAGNOSIS — I87392 Chronic venous hypertension (idiopathic) with other complications of left lower extremity: Secondary | ICD-10-CM | POA: Diagnosis not present

## 2024-05-19 DIAGNOSIS — G894 Chronic pain syndrome: Secondary | ICD-10-CM | POA: Diagnosis not present

## 2024-05-19 DIAGNOSIS — Z79891 Long term (current) use of opiate analgesic: Secondary | ICD-10-CM | POA: Diagnosis not present

## 2024-05-26 DIAGNOSIS — G894 Chronic pain syndrome: Secondary | ICD-10-CM | POA: Diagnosis not present

## 2024-05-26 DIAGNOSIS — M5137 Other intervertebral disc degeneration, lumbosacral region with discogenic back pain only: Secondary | ICD-10-CM | POA: Diagnosis not present

## 2024-05-26 DIAGNOSIS — J309 Allergic rhinitis, unspecified: Secondary | ICD-10-CM | POA: Diagnosis not present

## 2024-05-26 DIAGNOSIS — J3081 Allergic rhinitis due to animal (cat) (dog) hair and dander: Secondary | ICD-10-CM | POA: Diagnosis not present

## 2024-05-26 DIAGNOSIS — Z79891 Long term (current) use of opiate analgesic: Secondary | ICD-10-CM | POA: Diagnosis not present

## 2024-05-26 DIAGNOSIS — J301 Allergic rhinitis due to pollen: Secondary | ICD-10-CM | POA: Diagnosis not present

## 2024-05-30 DIAGNOSIS — I83892 Varicose veins of left lower extremities with other complications: Secondary | ICD-10-CM | POA: Diagnosis not present

## 2024-06-09 DIAGNOSIS — E559 Vitamin D deficiency, unspecified: Secondary | ICD-10-CM | POA: Diagnosis not present

## 2024-06-09 DIAGNOSIS — R5383 Other fatigue: Secondary | ICD-10-CM | POA: Diagnosis not present

## 2024-06-09 DIAGNOSIS — G894 Chronic pain syndrome: Secondary | ICD-10-CM | POA: Diagnosis not present

## 2024-06-09 DIAGNOSIS — E550 Rickets, active: Secondary | ICD-10-CM | POA: Diagnosis not present

## 2024-06-20 DIAGNOSIS — R439 Unspecified disturbances of smell and taste: Secondary | ICD-10-CM | POA: Diagnosis not present

## 2024-06-20 DIAGNOSIS — R0981 Nasal congestion: Secondary | ICD-10-CM | POA: Diagnosis not present

## 2024-06-20 DIAGNOSIS — J329 Chronic sinusitis, unspecified: Secondary | ICD-10-CM | POA: Diagnosis not present

## 2024-06-20 DIAGNOSIS — J343 Hypertrophy of nasal turbinates: Secondary | ICD-10-CM | POA: Diagnosis not present

## 2024-06-22 DIAGNOSIS — N3001 Acute cystitis with hematuria: Secondary | ICD-10-CM | POA: Diagnosis not present

## 2024-06-22 DIAGNOSIS — N3090 Cystitis, unspecified without hematuria: Secondary | ICD-10-CM | POA: Diagnosis not present

## 2024-07-04 DIAGNOSIS — M5137 Other intervertebral disc degeneration, lumbosacral region with discogenic back pain only: Secondary | ICD-10-CM | POA: Diagnosis not present

## 2024-07-04 DIAGNOSIS — J309 Allergic rhinitis, unspecified: Secondary | ICD-10-CM | POA: Diagnosis not present

## 2024-07-04 DIAGNOSIS — J301 Allergic rhinitis due to pollen: Secondary | ICD-10-CM | POA: Diagnosis not present

## 2024-07-06 DIAGNOSIS — R300 Dysuria: Secondary | ICD-10-CM | POA: Diagnosis not present

## 2024-07-06 DIAGNOSIS — B029 Zoster without complications: Secondary | ICD-10-CM | POA: Diagnosis not present

## 2024-07-11 DIAGNOSIS — J328 Other chronic sinusitis: Secondary | ICD-10-CM | POA: Diagnosis not present

## 2024-07-11 DIAGNOSIS — J320 Chronic maxillary sinusitis: Secondary | ICD-10-CM | POA: Diagnosis not present

## 2024-07-11 DIAGNOSIS — R439 Unspecified disturbances of smell and taste: Secondary | ICD-10-CM | POA: Diagnosis not present

## 2024-07-27 DIAGNOSIS — Z79899 Other long term (current) drug therapy: Secondary | ICD-10-CM | POA: Diagnosis not present

## 2024-07-27 DIAGNOSIS — E039 Hypothyroidism, unspecified: Secondary | ICD-10-CM | POA: Diagnosis not present

## 2024-07-27 DIAGNOSIS — I10 Essential (primary) hypertension: Secondary | ICD-10-CM | POA: Diagnosis not present

## 2024-07-27 DIAGNOSIS — E559 Vitamin D deficiency, unspecified: Secondary | ICD-10-CM | POA: Diagnosis not present

## 2024-07-27 DIAGNOSIS — Z86718 Personal history of other venous thrombosis and embolism: Secondary | ICD-10-CM | POA: Diagnosis not present

## 2024-07-27 DIAGNOSIS — M1990 Unspecified osteoarthritis, unspecified site: Secondary | ICD-10-CM | POA: Diagnosis not present

## 2024-07-27 DIAGNOSIS — M797 Fibromyalgia: Secondary | ICD-10-CM | POA: Diagnosis not present

## 2024-07-27 DIAGNOSIS — F17210 Nicotine dependence, cigarettes, uncomplicated: Secondary | ICD-10-CM | POA: Diagnosis not present

## 2024-07-28 DIAGNOSIS — M1712 Unilateral primary osteoarthritis, left knee: Secondary | ICD-10-CM | POA: Diagnosis not present

## 2024-07-31 DIAGNOSIS — R0981 Nasal congestion: Secondary | ICD-10-CM | POA: Diagnosis not present

## 2024-07-31 DIAGNOSIS — J329 Chronic sinusitis, unspecified: Secondary | ICD-10-CM | POA: Diagnosis not present

## 2024-07-31 DIAGNOSIS — J342 Deviated nasal septum: Secondary | ICD-10-CM | POA: Diagnosis not present

## 2024-07-31 DIAGNOSIS — K047 Periapical abscess without sinus: Secondary | ICD-10-CM | POA: Diagnosis not present

## 2024-07-31 DIAGNOSIS — R439 Unspecified disturbances of smell and taste: Secondary | ICD-10-CM | POA: Diagnosis not present

## 2024-08-01 DIAGNOSIS — M5137 Other intervertebral disc degeneration, lumbosacral region with discogenic back pain only: Secondary | ICD-10-CM | POA: Diagnosis not present

## 2024-08-01 DIAGNOSIS — J301 Allergic rhinitis due to pollen: Secondary | ICD-10-CM | POA: Diagnosis not present

## 2024-08-01 DIAGNOSIS — J309 Allergic rhinitis, unspecified: Secondary | ICD-10-CM | POA: Diagnosis not present

## 2024-08-11 DIAGNOSIS — Z9889 Other specified postprocedural states: Secondary | ICD-10-CM | POA: Diagnosis not present

## 2024-08-25 DIAGNOSIS — I872 Venous insufficiency (chronic) (peripheral): Secondary | ICD-10-CM | POA: Diagnosis not present

## 2024-08-25 DIAGNOSIS — I83812 Varicose veins of left lower extremities with pain: Secondary | ICD-10-CM | POA: Diagnosis not present

## 2024-08-25 DIAGNOSIS — I10 Essential (primary) hypertension: Secondary | ICD-10-CM | POA: Diagnosis not present

## 2024-08-25 DIAGNOSIS — R600 Localized edema: Secondary | ICD-10-CM | POA: Diagnosis not present

## 2024-08-25 DIAGNOSIS — L299 Pruritus, unspecified: Secondary | ICD-10-CM | POA: Diagnosis not present

## 2024-08-31 DIAGNOSIS — N390 Urinary tract infection, site not specified: Secondary | ICD-10-CM | POA: Diagnosis not present

## 2024-08-31 DIAGNOSIS — G8929 Other chronic pain: Secondary | ICD-10-CM | POA: Diagnosis not present

## 2024-08-31 DIAGNOSIS — E039 Hypothyroidism, unspecified: Secondary | ICD-10-CM | POA: Diagnosis not present

## 2024-11-15 ENCOUNTER — Ambulatory Visit: Admitting: Cardiology

## 2024-11-23 ENCOUNTER — Other Ambulatory Visit (HOSPITAL_BASED_OUTPATIENT_CLINIC_OR_DEPARTMENT_OTHER): Payer: Self-pay | Admitting: Geriatric Medicine

## 2024-11-23 DIAGNOSIS — Z13820 Encounter for screening for osteoporosis: Secondary | ICD-10-CM

## 2024-11-23 DIAGNOSIS — Z122 Encounter for screening for malignant neoplasm of respiratory organs: Secondary | ICD-10-CM

## 2024-11-29 ENCOUNTER — Inpatient Hospital Stay (HOSPITAL_BASED_OUTPATIENT_CLINIC_OR_DEPARTMENT_OTHER): Admission: RE | Admit: 2024-11-29 | Source: Ambulatory Visit | Admitting: Radiology

## 2024-11-29 ENCOUNTER — Other Ambulatory Visit (HOSPITAL_BASED_OUTPATIENT_CLINIC_OR_DEPARTMENT_OTHER): Admitting: Radiology

## 2024-12-05 ENCOUNTER — Other Ambulatory Visit (HOSPITAL_BASED_OUTPATIENT_CLINIC_OR_DEPARTMENT_OTHER): Admitting: Radiology

## 2024-12-05 ENCOUNTER — Ambulatory Visit (HOSPITAL_BASED_OUTPATIENT_CLINIC_OR_DEPARTMENT_OTHER): Admitting: Radiology

## 2024-12-12 ENCOUNTER — Other Ambulatory Visit (HOSPITAL_BASED_OUTPATIENT_CLINIC_OR_DEPARTMENT_OTHER): Admitting: Radiology

## 2024-12-12 ENCOUNTER — Ambulatory Visit (HOSPITAL_BASED_OUTPATIENT_CLINIC_OR_DEPARTMENT_OTHER): Admitting: Radiology

## 2024-12-21 ENCOUNTER — Ambulatory Visit: Admitting: Cardiology
